# Patient Record
Sex: Female | Born: 1945 | Race: White | Hispanic: No | Smoking: Never smoker
Health system: Southern US, Community
[De-identification: ages and names within clinical notes are randomized; demographics above are authoritative.]

## PROBLEM LIST (undated history)

## (undated) DIAGNOSIS — G43909 Migraine, unspecified, not intractable, without status migrainosus: Secondary | ICD-10-CM

## (undated) DIAGNOSIS — G20C Parkinsonism, unspecified: Secondary | ICD-10-CM

## (undated) DIAGNOSIS — E1169 Type 2 diabetes mellitus with other specified complication: Secondary | ICD-10-CM

## (undated) DIAGNOSIS — J301 Allergic rhinitis due to pollen: Secondary | ICD-10-CM

## (undated) DIAGNOSIS — D649 Anemia, unspecified: Secondary | ICD-10-CM

## (undated) DIAGNOSIS — E039 Hypothyroidism, unspecified: Secondary | ICD-10-CM

## (undated) DIAGNOSIS — M1990 Unspecified osteoarthritis, unspecified site: Secondary | ICD-10-CM

## (undated) DIAGNOSIS — I10 Essential (primary) hypertension: Secondary | ICD-10-CM

## (undated) DIAGNOSIS — E785 Hyperlipidemia, unspecified: Secondary | ICD-10-CM

## (undated) DIAGNOSIS — E669 Obesity, unspecified: Secondary | ICD-10-CM

## (undated) HISTORY — PX: OTHER SURGICAL HISTORY: SHX169

## (undated) HISTORY — DX: Hyperlipidemia, unspecified: E78.5

## (undated) HISTORY — DX: Essential (primary) hypertension: I10

## (undated) HISTORY — DX: Migraine, unspecified, not intractable, without status migrainosus: G43.909

## (undated) HISTORY — DX: Hypothyroidism, unspecified: E03.9

## (undated) HISTORY — DX: Parkinsonism, unspecified: G20.C

## (undated) HISTORY — DX: Obesity, unspecified: E66.9

## (undated) HISTORY — DX: Anemia, unspecified: D64.9

## (undated) HISTORY — DX: Unspecified osteoarthritis, unspecified site: M19.90

## (undated) HISTORY — DX: Allergic rhinitis due to pollen: J30.1

## (undated) HISTORY — PX: TUBAL LIGATION: SHX77

## (undated) HISTORY — DX: Type 2 diabetes mellitus with other specified complication: E11.69

---

## 1999-04-18 ENCOUNTER — Encounter: Admission: RE | Admit: 1999-04-18 | Discharge: 1999-04-18 | Payer: Self-pay | Admitting: Orthopedic Surgery

## 1999-04-18 ENCOUNTER — Encounter: Payer: Self-pay | Admitting: Orthopedic Surgery

## 1999-06-18 ENCOUNTER — Other Ambulatory Visit: Admission: RE | Admit: 1999-06-18 | Discharge: 1999-06-18 | Payer: Self-pay | Admitting: Gynecology

## 2000-09-03 ENCOUNTER — Other Ambulatory Visit: Admission: RE | Admit: 2000-09-03 | Discharge: 2000-09-03 | Payer: Self-pay | Admitting: Gynecology

## 2000-10-13 ENCOUNTER — Encounter (INDEPENDENT_AMBULATORY_CARE_PROVIDER_SITE_OTHER): Payer: Self-pay | Admitting: Specialist

## 2000-10-13 ENCOUNTER — Other Ambulatory Visit: Admission: RE | Admit: 2000-10-13 | Discharge: 2000-10-13 | Payer: Self-pay | Admitting: Gynecology

## 2001-05-31 ENCOUNTER — Other Ambulatory Visit: Admission: RE | Admit: 2001-05-31 | Discharge: 2001-05-31 | Payer: Self-pay | Admitting: Gynecology

## 2002-08-16 ENCOUNTER — Other Ambulatory Visit: Admission: RE | Admit: 2002-08-16 | Discharge: 2002-08-16 | Payer: Self-pay | Admitting: Obstetrics and Gynecology

## 2004-09-16 ENCOUNTER — Other Ambulatory Visit: Admission: RE | Admit: 2004-09-16 | Discharge: 2004-09-16 | Payer: Self-pay | Admitting: Gynecology

## 2009-03-24 HISTORY — PX: TOTAL HIP ARTHROPLASTY: SHX124

## 2009-10-03 ENCOUNTER — Inpatient Hospital Stay (HOSPITAL_COMMUNITY): Admission: RE | Admit: 2009-10-03 | Discharge: 2009-10-06 | Payer: Self-pay | Admitting: Orthopedic Surgery

## 2010-06-08 LAB — URINALYSIS, MICROSCOPIC ONLY
Bilirubin Urine: NEGATIVE
Glucose, UA: NEGATIVE mg/dL
Ketones, ur: NEGATIVE mg/dL
Leukocytes, UA: NEGATIVE
Nitrite: NEGATIVE
Protein, ur: NEGATIVE mg/dL
Specific Gravity, Urine: 1.018 (ref 1.005–1.030)
Urobilinogen, UA: 1 mg/dL (ref 0.0–1.0)
pH: 6 (ref 5.0–8.0)

## 2010-06-08 LAB — BASIC METABOLIC PANEL
BUN: 6 mg/dL (ref 6–23)
BUN: 7 mg/dL (ref 6–23)
CO2: 29 mEq/L (ref 19–32)
CO2: 29 mEq/L (ref 19–32)
Calcium: 8.3 mg/dL — ABNORMAL LOW (ref 8.4–10.5)
Calcium: 8.5 mg/dL (ref 8.4–10.5)
Chloride: 105 mEq/L (ref 96–112)
Chloride: 106 mEq/L (ref 96–112)
Creatinine, Ser: 0.77 mg/dL (ref 0.4–1.2)
Creatinine, Ser: 0.83 mg/dL (ref 0.4–1.2)
GFR calc Af Amer: >60 mL/min (ref >60)
GFR calc Af Amer: >60 mL/min (ref >60)
GFR calc non Af Amer: >60 mL/min (ref >60)
GFR calc non Af Amer: >60 mL/min (ref >60)
Glucose, Bld: 153 mg/dL — ABNORMAL HIGH (ref 70–99)
Glucose, Bld: 173 mg/dL — ABNORMAL HIGH (ref 70–99)
Potassium: 3.4 mEq/L — ABNORMAL LOW (ref 3.5–5.1)
Potassium: 3.6 mEq/L (ref 3.5–5.1)
Sodium: 138 mEq/L (ref 135–145)
Sodium: 142 mEq/L (ref 135–145)

## 2010-06-08 LAB — CBC
HCT: 22.3 % — ABNORMAL LOW (ref 36.0–46.0)
HCT: 31.9 % — ABNORMAL LOW (ref 36.0–46.0)
Hemoglobin: 11 g/dL — ABNORMAL LOW (ref 12.0–15.0)
Hemoglobin: 7.8 g/dL — ABNORMAL LOW (ref 12.0–15.0)
MCH: 30.9 pg (ref 26.0–34.0)
MCH: 31.1 pg (ref 26.0–34.0)
MCHC: 34.6 g/dL (ref 30.0–36.0)
MCHC: 35 g/dL (ref 30.0–36.0)
MCV: 89 fL (ref 78.0–100.0)
MCV: 89.1 fL (ref 78.0–100.0)
Platelets: 131 10*3/uL — ABNORMAL LOW (ref 150–400)
Platelets: 148 10*3/uL — ABNORMAL LOW (ref 150–400)
RBC: 2.5 MIL/uL — ABNORMAL LOW (ref 3.87–5.11)
RBC: 3.58 MIL/uL — ABNORMAL LOW (ref 3.87–5.11)
RDW: 14.8 % (ref 11.5–15.5)
RDW: 15.3 % (ref 11.5–15.5)
WBC: 6.7 10*3/uL (ref 4.0–10.5)
WBC: 7 10*3/uL (ref 4.0–10.5)

## 2010-06-08 LAB — PROTIME-INR
INR: 1.52 — ABNORMAL HIGH (ref 0.00–1.49)
INR: 1.74 — ABNORMAL HIGH (ref 0.00–1.49)
Prothrombin Time: 18.2 seconds — ABNORMAL HIGH (ref 11.6–15.2)
Prothrombin Time: 20.2 seconds — ABNORMAL HIGH (ref 11.6–15.2)

## 2010-06-08 LAB — PREPARE RBC (CROSSMATCH)

## 2010-06-09 LAB — TYPE AND SCREEN
ABO/RH(D): O POS
Antibody Screen: NEGATIVE

## 2010-06-09 LAB — COMPREHENSIVE METABOLIC PANEL
ALT: 35 U/L (ref 0–35)
AST: 26 U/L (ref 0–37)
Albumin: 4.3 g/dL (ref 3.5–5.2)
Alkaline Phosphatase: 78 U/L (ref 39–117)
BUN: 11 mg/dL (ref 6–23)
CO2: 29 mEq/L (ref 19–32)
Calcium: 10.1 mg/dL (ref 8.4–10.5)
Chloride: 105 mEq/L (ref 96–112)
Creatinine, Ser: 0.84 mg/dL (ref 0.4–1.2)
GFR calc Af Amer: >60 mL/min (ref >60)
GFR calc non Af Amer: >60 mL/min (ref >60)
Glucose, Bld: 124 mg/dL — ABNORMAL HIGH (ref 70–99)
Potassium: 3.9 mEq/L (ref 3.5–5.1)
Sodium: 142 mEq/L (ref 135–145)
Total Bilirubin: 1 mg/dL (ref 0.3–1.2)
Total Protein: 7.3 g/dL (ref 6.0–8.3)

## 2010-06-09 LAB — PROTIME-INR
INR: 1.05 (ref 0.00–1.49)
INR: 1.18 (ref 0.00–1.49)
Prothrombin Time: 13.6 seconds (ref 11.6–15.2)
Prothrombin Time: 14.9 seconds (ref 11.6–15.2)

## 2010-06-09 LAB — CBC
HCT: 25.6 % — ABNORMAL LOW (ref 36.0–46.0)
HCT: 38.8 % (ref 36.0–46.0)
Hemoglobin: 13.7 g/dL (ref 12.0–15.0)
Hemoglobin: 8.9 g/dL — ABNORMAL LOW (ref 12.0–15.0)
MCH: 30.9 pg (ref 26.0–34.0)
MCH: 31.4 pg (ref 26.0–34.0)
MCHC: 34.9 g/dL (ref 30.0–36.0)
MCHC: 35.2 g/dL (ref 30.0–36.0)
MCV: 88.5 fL (ref 78.0–100.0)
MCV: 89 fL (ref 78.0–100.0)
Platelets: 158 10*3/uL (ref 150–400)
Platelets: 187 10*3/uL (ref 150–400)
RBC: 2.89 MIL/uL — ABNORMAL LOW (ref 3.87–5.11)
RBC: 4.36 MIL/uL (ref 3.87–5.11)
RDW: 14.3 % (ref 11.5–15.5)
RDW: 14.7 % (ref 11.5–15.5)
WBC: 4.5 10*3/uL (ref 4.0–10.5)
WBC: 6.4 10*3/uL (ref 4.0–10.5)

## 2010-06-09 LAB — BASIC METABOLIC PANEL
BUN: 7 mg/dL (ref 6–23)
CO2: 29 mEq/L (ref 19–32)
Calcium: 8.5 mg/dL (ref 8.4–10.5)
Chloride: 107 mEq/L (ref 96–112)
Creatinine, Ser: 0.73 mg/dL (ref 0.4–1.2)
GFR calc Af Amer: >60 mL/min (ref >60)
GFR calc non Af Amer: >60 mL/min (ref >60)
Glucose, Bld: 137 mg/dL — ABNORMAL HIGH (ref 70–99)
Potassium: 4.6 mEq/L (ref 3.5–5.1)
Sodium: 140 mEq/L (ref 135–145)

## 2010-06-09 LAB — URINALYSIS, ROUTINE W REFLEX MICROSCOPIC
Bilirubin Urine: NEGATIVE
Glucose, UA: NEGATIVE mg/dL
Hgb urine dipstick: NEGATIVE
Ketones, ur: NEGATIVE mg/dL
Nitrite: NEGATIVE
Protein, ur: NEGATIVE mg/dL
Specific Gravity, Urine: 1.013 (ref 1.005–1.030)
Urobilinogen, UA: 0.2 mg/dL (ref 0.0–1.0)
pH: 6 (ref 5.0–8.0)

## 2010-06-09 LAB — ABO/RH: ABO/RH(D): O POS

## 2010-06-09 LAB — APTT: aPTT: 33 seconds (ref 24–37)

## 2010-06-09 LAB — SURGICAL PCR SCREEN
MRSA, PCR: NEGATIVE
Staphylococcus aureus: NEGATIVE

## 2012-06-14 ENCOUNTER — Telehealth: Payer: Self-pay

## 2012-06-14 NOTE — Telephone Encounter (Signed)
Pt has appt to establish with Dr Patsy Lager on 06/21/12; pt going over her paperwork and not sure what records to request from her previous physician. Pt said she will bring form with her to 03/31 appt and get Dr Cyndie Chime guidance of what information if any he would like from pts previous physician if any.

## 2012-06-21 ENCOUNTER — Ambulatory Visit (INDEPENDENT_AMBULATORY_CARE_PROVIDER_SITE_OTHER): Payer: Medicare Other | Admitting: Family Medicine

## 2012-06-21 ENCOUNTER — Encounter: Payer: Self-pay | Admitting: Family Medicine

## 2012-06-21 VITALS — BP 132/83 | HR 92 | Temp 98.3°F | Ht 65.0 in | Wt 202.0 lb

## 2012-06-21 DIAGNOSIS — J301 Allergic rhinitis due to pollen: Secondary | ICD-10-CM

## 2012-06-21 DIAGNOSIS — I10 Essential (primary) hypertension: Secondary | ICD-10-CM | POA: Insufficient documentation

## 2012-06-21 DIAGNOSIS — M129 Arthropathy, unspecified: Secondary | ICD-10-CM

## 2012-06-21 DIAGNOSIS — E039 Hypothyroidism, unspecified: Secondary | ICD-10-CM

## 2012-06-21 DIAGNOSIS — M1990 Unspecified osteoarthritis, unspecified site: Secondary | ICD-10-CM

## 2012-06-21 DIAGNOSIS — R42 Dizziness and giddiness: Secondary | ICD-10-CM

## 2012-06-21 DIAGNOSIS — E785 Hyperlipidemia, unspecified: Secondary | ICD-10-CM

## 2012-06-21 DIAGNOSIS — E782 Mixed hyperlipidemia: Secondary | ICD-10-CM | POA: Insufficient documentation

## 2012-06-21 DIAGNOSIS — G43909 Migraine, unspecified, not intractable, without status migrainosus: Secondary | ICD-10-CM

## 2012-06-21 LAB — CBC WITH DIFFERENTIAL/PLATELET
Basophils Absolute: 0 10*3/uL (ref 0.0–0.1)
Basophils Relative: 0.3 % (ref 0.0–3.0)
Eosinophils Absolute: 0.1 10*3/uL (ref 0.0–0.7)
Eosinophils Relative: 0.7 % (ref 0.0–5.0)
HCT: 41 % (ref 36.0–46.0)
Hemoglobin: 13.8 g/dL (ref 12.0–15.0)
Lymphocytes Relative: 16.1 % (ref 12.0–46.0)
Lymphs Abs: 1.3 10*3/uL (ref 0.7–4.0)
MCHC: 33.6 g/dL (ref 30.0–36.0)
MCV: 87 fl (ref 78.0–100.0)
Monocytes Absolute: 0.6 10*3/uL (ref 0.1–1.0)
Monocytes Relative: 7.6 % (ref 3.0–12.0)
Neutro Abs: 5.9 10*3/uL (ref 1.4–7.7)
Neutrophils Relative %: 75.3 % (ref 43.0–77.0)
Platelets: 207 10*3/uL (ref 150.0–400.0)
RBC: 4.71 Mil/uL (ref 3.87–5.11)
RDW: 14.1 % (ref 11.5–14.6)
WBC: 7.8 10*3/uL (ref 4.5–10.5)

## 2012-06-21 LAB — BASIC METABOLIC PANEL
BUN: 12 mg/dL (ref 6–23)
CO2: 29 mEq/L (ref 19–32)
Calcium: 9.8 mg/dL (ref 8.4–10.5)
Chloride: 97 mEq/L (ref 96–112)
Creatinine, Ser: 0.9 mg/dL (ref 0.4–1.2)
GFR: 65.85 mL/min (ref >60.00)
Glucose, Bld: 137 mg/dL — ABNORMAL HIGH (ref 70–99)
Potassium: 3.9 mEq/L (ref 3.5–5.1)
Sodium: 136 mEq/L (ref 135–145)

## 2012-06-21 LAB — TSH: TSH: 1.98 u[IU]/mL (ref 0.35–5.50)

## 2012-06-21 NOTE — Patient Instructions (Signed)
F/u in next 6 months for full CPX

## 2012-06-21 NOTE — Progress Notes (Signed)
Nature conservation officer at [facility] [address] [place] [address] Phone: [phone] Fax: [phone]  Date:  06/21/2012   Name:  Kim Cole   DOB:  1945/11/22   MRN:  [id number] Gender: female Age: 67 y.o.  Primary Physician:  Hannah Beat, MD  Evaluating MD: Hannah Beat, MD   Chief Complaint: Establish Care   History of Present Illness:  Kim Cole is a 67 y.o. pleasant patient who presents with the following:  Was seeing [facility] in [place]. Works for [employer].   R hip replacement - Dr. Merlyn Albert  Father has carotid disease - plaque broke off.  Has always been normal - auscultation.   Has some concerns about snoring - sometimes all night long.  Sleep apnea? Never feels tired during the day  No labs in about 12-18 months. Needs thyroid rechecked.  Hand OA, R  Now will sometimes feel a little bit dizzy. Took BP and it was in normal range. A month or 2 ago when that was happening she donated a couple of bags of what sounds like plasma.  Patient Active Problem List  Diagnosis  . Arthritis  . Allergic rhinitis due to pollen  . Hyperlipidemia  . Hypertension  . Migraine  . Hypothyroid    Past Medical History  Diagnosis Date  . Anemia   . Arthritis   . Allergic rhinitis due to pollen   . Hyperlipidemia   . Hypertension   . Migraine   . Hypothyroid     Past Surgical History  Procedure Laterality Date  . Tubal ligation    . Total hip arthroplasty Right 2011    Dr. Merlyn Albert    History   Social History  . Marital Status: Married    Spouse Name: N/A    Number of Children: N/A  . Years of Education: N/A   Occupational History  .  [employer]   Social History Main Topics  . Smoking status: Never Smoker   . Smokeless tobacco: Never Used  . Alcohol Use: Yes  . Drug Use: No  . Sexually Active: Not on file   Other Topics Concern  . Not on file   Social History Narrative   Worked  40 years for [employer] / [employer]   Married   Children and Clinton Gallant Rueger's mother    No family history on file.  No Known Allergies  No current outpatient prescriptions on file prior to visit.   No current facility-administered medications on file prior to visit.     Review of Systems:   GEN: No acute illnesses, no fevers, chills. GI: No n/v/d, eating normally Pulm: No SOB Interactive and getting along well at home.  Otherwise, ROS is as per the HPI.   Physical Examination: Filed Vitals:   06/21/12 1355 06/21/12 1545 06/21/12 1551 06/21/12 1552  BP: 130/80 141/82 125/82 132/83  Pulse: 82 80 84 92  Temp: 98.3 F (36.8 C)     TempSrc: Oral     Height: 5\' 5"  (1.651 m)     Weight: 202 lb (91.627 kg)     SpO2: 96%        Ideal Body Weight: Weight in (lb) to have BMI = 25: 149.9   GEN: WDWN, NAD, Non-toxic, A & O x 3 HEENT: Atraumatic, Normocephalic. Neck supple. No masses, No LAD. Ears and Nose: No external deformity. CV: RRR, No M/G/R. No JVD. No thrill. No extra heart  sounds. PULM: CTA B, no wheezes, crackles, rhonchi. No retractions. No resp. distress. No accessory muscle use. EXTR: No c/c/e NEURO Normal gait.  PSYCH: Normally interactive. Conversant. Not depressed or anxious appearing.  Calm demeanor.    Assessment and Plan:  Hypothyroid - Plan: TSH  Arthritis  Allergic rhinitis due to pollen  Hyperlipidemia  Hypertension  Migraine  Dizziness and giddiness - Plan: CBC with Differential, Basic metabolic panel  Generally doing well.  Check labs to rule out basic dizziness causes. BP does drop some with movement, suggested keeping hydrated.  Results for orders placed in visit on 06/21/12  TSH      Result Value Range   TSH 1.98  0.35 - 5.50 uIU/mL  CBC WITH DIFFERENTIAL      Result Value Range   WBC 7.8  4.5 - 10.5 K/uL   RBC 4.71  3.87 - 5.11 Mil/uL   Hemoglobin 13.8  12.0 - 15.0 g/dL   HCT  14.7  82.9 - 56.2 %   MCV 87.0  78.0 - 100.0 fl   MCHC 33.6  30.0 - 36.0 g/dL   RDW 13.0  86.5 - 78.4 %   Platelets 207.0  150.0 - 400.0 K/uL   Neutrophils Relative 75.3  43.0 - 77.0 %   Lymphocytes Relative 16.1  12.0 - 46.0 %   Monocytes Relative 7.6  3.0 - 12.0 %   Eosinophils Relative 0.7  0.0 - 5.0 %   Basophils Relative 0.3  0.0 - 3.0 %   Neutro Abs 5.9  1.4 - 7.7 K/uL   Lymphs Abs 1.3  0.7 - 4.0 K/uL   Monocytes Absolute 0.6  0.1 - 1.0 K/uL   Eosinophils Absolute 0.1  0.0 - 0.7 K/uL   Basophils Absolute 0.0  0.0 - 0.1 K/uL  BASIC METABOLIC PANEL      Result Value Range   Sodium 136  135 - 145 mEq/L   Potassium 3.9  3.5 - 5.1 mEq/L   Chloride 97  96 - 112 mEq/L   CO2 29  19 - 32 mEq/L   Glucose, Bld 137 (*) 70 - 99 mg/dL   BUN 12  6 - 23 mg/dL   Creatinine, Ser 0.9  0.4 - 1.2 mg/dL   Calcium 9.8  8.4 - 69.6 mg/dL   GFR 29.52  >84.13 mL/min     Orders Today:  Orders Placed This Encounter  Procedures  . TSH  . CBC with Differential  . Basic metabolic panel    Updated Medication List: (Includes new medications, updates to list, dose adjustments) Meds ordered this encounter  Medications  . lisinopril-hydrochlorothiazide (PRINZIDE,ZESTORETIC) 10-12.5 MG per tablet    Sig: Take 1 tablet by mouth daily.  Marland Kitchen levothyroxine (SYNTHROID, LEVOTHROID) 88 MCG tablet    Sig: Take 88 mcg by mouth daily.  . simvastatin (ZOCOR) 20 MG tablet    Sig: Take 20 mg by mouth every evening.  . vitamin E 400 UNIT capsule    Sig: Take 400 Units by mouth daily.  . Misc Natural Products (GLUCOSAMINE CHONDROITIN VIT D3) CAPS    Sig: Take by mouth.    Medications Discontinued: There are no discontinued medications.    Signed, Elpidio Galea. Kyrianna Barletta, MD 06/21/2012 2:11 PM

## 2012-06-22 ENCOUNTER — Encounter: Payer: Self-pay | Admitting: Family Medicine

## 2012-06-23 ENCOUNTER — Encounter: Payer: Self-pay | Admitting: *Deleted

## 2012-07-08 ENCOUNTER — Other Ambulatory Visit: Payer: Self-pay

## 2012-07-08 NOTE — Telephone Encounter (Signed)
Pt left v/m requesting levothyroxine 88 mcg be called in but pt did not leave pharmacy. Left v/m for pt to call back with pharmacy info.

## 2012-07-12 ENCOUNTER — Ambulatory Visit: Payer: Self-pay | Admitting: Family Medicine

## 2012-07-12 MED ORDER — LEVOTHYROXINE SODIUM 88 MCG PO TABS: 88.0000 ug | ORAL_TABLET | Freq: Every day | ORAL | Status: DC

## 2012-07-12 NOTE — Telephone Encounter (Signed)
Pt said she does not have acct set up with [facility] yet so needs written rx Levothyroxine . Call pt when rx ready for pick up.

## 2012-07-12 NOTE — Telephone Encounter (Signed)
  Hannah Beat, MD 07/12/2012, 10:13 AM

## 2012-08-05 ENCOUNTER — Other Ambulatory Visit: Payer: Self-pay

## 2012-08-05 MED ORDER — SIMVASTATIN 20 MG PO TABS: 20.0000 mg | ORAL_TABLET | Freq: Every evening | ORAL | Status: DC

## 2012-08-05 MED ORDER — LISINOPRIL-HYDROCHLOROTHIAZIDE 10-12.5 MG PO TABS: 1.0000 | ORAL_TABLET | Freq: Every day | ORAL | Status: DC

## 2012-08-05 NOTE — Telephone Encounter (Signed)
Pt request refill lisinopril HCTZ and simvastatin to [facility]. Advised pt done.

## 2012-10-11 ENCOUNTER — Encounter: Payer: Self-pay | Admitting: Family Medicine

## 2012-10-11 ENCOUNTER — Ambulatory Visit (INDEPENDENT_AMBULATORY_CARE_PROVIDER_SITE_OTHER): Payer: Medicare Other | Admitting: Family Medicine

## 2012-10-11 VITALS — BP 124/80 | HR 72 | Temp 98.3°F | Ht 65.0 in | Wt 203.8 lb

## 2012-10-11 DIAGNOSIS — R7309 Other abnormal glucose: Secondary | ICD-10-CM

## 2012-10-11 DIAGNOSIS — R739 Hyperglycemia, unspecified: Secondary | ICD-10-CM

## 2012-10-11 DIAGNOSIS — Z23 Encounter for immunization: Secondary | ICD-10-CM

## 2012-10-11 DIAGNOSIS — E039 Hypothyroidism, unspecified: Secondary | ICD-10-CM

## 2012-10-11 DIAGNOSIS — Z0000 Encounter for general adult medical examination without abnormal findings: Secondary | ICD-10-CM

## 2012-10-11 LAB — HEMOGLOBIN A1C: Hgb A1c MFr Bld: 7.1 % — ABNORMAL HIGH (ref 4.6–6.5)

## 2012-10-11 LAB — BASIC METABOLIC PANEL
BUN: 16 mg/dL (ref 6–23)
CO2: 28 mEq/L (ref 19–32)
Calcium: 9.5 mg/dL (ref 8.4–10.5)
Chloride: 102 mEq/L (ref 96–112)
Creatinine, Ser: 1 mg/dL (ref 0.4–1.2)
GFR: 61.85 mL/min (ref >60.00)
Glucose, Bld: 203 mg/dL — ABNORMAL HIGH (ref 70–99)
Potassium: 4 mEq/L (ref 3.5–5.1)
Sodium: 138 mEq/L (ref 135–145)

## 2012-10-11 LAB — TSH: TSH: 3.32 u[IU]/mL (ref 0.35–5.50)

## 2012-10-11 NOTE — Progress Notes (Signed)
Nature conservation officer at [facility] [address] [place] [address] Phone: [phone] Fax: [phone]  Date:  10/11/2012   Name:  Kim Cole   DOB:  08/15/1945   MRN:  [id number] Gender: female Age: 67 y.o.  Primary Physician:  Hannah Beat, MD  Evaluating MD: Hannah Beat, MD   Chief Complaint: Annual Exam   History of Present Illness:  Kim Cole is a 67 y.o. pleasant patient who presents with the following:  Medicare wellness:   Diabetes.  Colonoscopy, 3 years ago. Had  Polyps - 5 years.   Pneumovax - needs Shingles - vaccine done  [facility], mammogram done  Health Maintenance Summary Reviewed and updated, unless pt declines services.  Tobacco History Reviewed. Non-smoker Alcohol: No concerns, no excessive use Exercise Habits: none now STD concerns: none Drug Use: None Birth control method: n/a Menses regular: post-menopause Lumps or breast concerns: no  Health Maintenance  Topic Date Due  . Tetanus/tdap  01/12/1966  . Influenza Vaccine  11/22/2012  . Colonoscopy  03/24/2014  . Mammogram  10/12/2014  . Pneumococcal Polysaccharide Vaccine Age 61 And Over  Addressed  . Zostavax  Addressed    Labs reviewed with the patient.  Results for orders placed in visit on 06/21/12  TSH      Result Value Range   TSH 1.98  0.35 - 5.50 uIU/mL  CBC WITH DIFFERENTIAL      Result Value Range   WBC 7.8  4.5 - 10.5 K/uL   RBC 4.71  3.87 - 5.11 Mil/uL   Hemoglobin 13.8  12.0 - 15.0 g/dL   HCT 10.2  72.5 - 36.6 %   MCV 87.0  78.0 - 100.0 fl   MCHC 33.6  30.0 - 36.0 g/dL   RDW 44.0  34.7 - 42.5 %   Platelets 207.0  150.0 - 400.0 K/uL   Neutrophils Relative % 75.3  43.0 - 77.0 %   Lymphocytes Relative 16.1  12.0 - 46.0 %   Monocytes Relative 7.6  3.0 - 12.0 %   Eosinophils Relative 0.7  0.0 - 5.0 %   Basophils Relative 0.3  0.0 - 3.0 %   Neutro Abs 5.9  1.4 - 7.7 K/uL   Lymphs Abs 1.3  0.7 - 4.0 K/uL   Monocytes Absolute 0.6  0.1 -  1.0 K/uL   Eosinophils Absolute 0.1  0.0 - 0.7 K/uL   Basophils Absolute 0.0  0.0 - 0.1 K/uL  BASIC METABOLIC PANEL      Result Value Range   Sodium 136  135 - 145 mEq/L   Potassium 3.9  3.5 - 5.1 mEq/L   Chloride 97  96 - 112 mEq/L   CO2 29  19 - 32 mEq/L   Glucose, Bld 137 (*) 70 - 99 mg/dL   BUN 12  6 - 23 mg/dL   Creatinine, Ser 0.9  0.4 - 1.2 mg/dL   Calcium 9.8  8.4 - 95.6 mg/dL   GFR 38.75  >64.33 mL/min     Patient Active Problem List   Diagnosis Date Noted  . Arthritis   . Allergic rhinitis due to pollen   . Hyperlipidemia   . Hypertension   . Migraine   . Hypothyroid     Past Medical History  Diagnosis Date  . Anemia   . Arthritis   . Allergic rhinitis due to pollen   . Hyperlipidemia   . Hypertension   . Migraine   . Hypothyroid  Past Surgical History  Procedure Laterality Date  . Tubal ligation    . Total hip arthroplasty Right 2011    Dr. Merlyn Albert    History   Social History  . Marital Status: Married    Spouse Name: N/A    Number of Children: N/A  . Years of Education: N/A   Occupational History  .  [employer]   Social History Main Topics  . Smoking status: Never Smoker   . Smokeless tobacco: Never Used  . Alcohol Use: Yes  . Drug Use: No  . Sexually Active: Not on file   Other Topics Concern  . Not on file   Social History Narrative   Worked 40 years for [employer] / [employer]   Married   Children and Clinton Gallant Speakman's mother    No family history on file.  No Known Allergies  Medication list has been reviewed and updated.  Outpatient Prescriptions Prior to Visit  Medication Sig Dispense Refill  . levothyroxine (SYNTHROID, LEVOTHROID) 88 MCG tablet Take 1 tablet (88 mcg total) by mouth daily.  90 tablet  3  . lisinopril-hydrochlorothiazide (PRINZIDE,ZESTORETIC) 10-12.5 MG per tablet Take 1 tablet by mouth daily.  90 tablet  1  . simvastatin (ZOCOR) 20 MG tablet  Take 1 tablet (20 mg total) by mouth every evening.  90 tablet  1  . vitamin E 400 UNIT capsule Take 400 Units by mouth daily.      . Misc Natural Products (GLUCOSAMINE CHONDROITIN VIT D3) CAPS Take by mouth.       No facility-administered medications prior to visit.    Review of Systems:   General: Denies fever, chills, sweats. No significant weight loss. Eyes: Denies blurring,significant itching ENT: Denies earache, sore throat, and hoarseness.  Cardiovascular: Denies chest pains, palpitations, dyspnea on exertion,  Respiratory: Denies cough, dyspnea at rest,wheeezing Breast: no concerns about lumps GI: Denies nausea, vomiting, diarrhea, constipation, change in bowel habits, abdominal pain, melena, hematochezia GU: Denies dysuria, hematuria, urinary hesitancy, nocturia, denies STD risk, no concerns about discharge Musculoskeletal: Denies back pain, joint pain Derm: Denies rash, itching Neuro: Denies  paresthesias, frequent falls, frequent headaches Psych: Denies depression, anxiety Endocrine: Denies cold intolerance, heat intolerance, polydipsia Heme: Denies enlarged lymph nodes Allergy: No hayfever  Physical Examination: BP 124/80  Pulse 72  Temp(Src) 98.3 F (36.8 C) (Oral)  Ht 5\' 5"  (1.651 m)  Wt 203 lb 12 oz (92.42 kg)  BMI 33.91 kg/m2  Ideal Body Weight: Weight in (lb) to have BMI = 25: 149.9   GEN: well developed, well nourished, no acute distress Eyes: conjunctiva and lids normal, PERRLA, EOMI ENT: TM clear, nares clear, oral exam WNL Neck: supple, no lymphadenopathy, no thyromegaly, no JVD Pulm: clear to auscultation and percussion, respiratory effort normal CV: regular rate and rhythm, S1-S2, no murmur, rub or gallop, no bruits Chest: no scars, masses, no lumps BREAST: breast exam declined GI: soft, non-tender; no hepatosplenomegaly, masses; active bowel sounds all quadrants GU: GU exam declined Lymph: no cervical, axillary or inguinal adenopathy MSK: gait  normal, muscle tone and strength WNL, no joint swelling, effusions, discoloration, crepitus  SKIN: clear, good turgor, color WNL, no rashes, lesions, or ulcerations Neuro: normal mental status, normal strength, sensation, and motion Psych: alert; oriented to person, place and time, normally interactive and not anxious or depressed in appearance.   Assessment and Plan:  Routine general medical examination at a health care facility  Hyperglycemia - Plan: Basic metabolic  panel, Hemoglobin A1c  Hypothyroid - Plan: TSH  I have personally reviewed the Medicare Annual Wellness questionnaire and have noted 1. The patient's medical and social history 2. Their use of alcohol, tobacco or illicit drugs 3. Their current medications and supplements 4. The patient's functional ability including ADL's, fall risks, home safety risks and hearing or visual             impairment. 5. Diet and physical activities 6. Evidence for depression or mood disorders  The patients weight, height, BMI and visual acuity have been recorded in the chart I have made referrals, counseling and provided education to the patient based review of the above and I have provided the pt with a written personalized care plan for preventive services.  I have provided the patient with a copy of your personalized plan for preventive services. Instructed to take the time to review along with their updated medication list.   BS at 176 at work labs, recheck and check a1c. Check thyroid  Orders Today:  Orders Placed This Encounter  Procedures  . Basic metabolic panel  . Hemoglobin A1c  . TSH    Updated Medication List: (Includes new medications, updates to list, dose adjustments) Meds ordered this encounter  Medications  . cholecalciferol (VITAMIN D) 1000 UNITS tablet    Sig: Take 1,000 Units by mouth daily.    Medications Discontinued: There are no discontinued medications.    Signed, Elpidio Galea. [employer],  MD 10/11/2012 8:45 AM

## 2012-10-11 NOTE — Addendum Note (Signed)
Addended by: Sydell Axon C on: 10/11/2012 01:22 PM   Modules accepted: Orders

## 2012-10-18 ENCOUNTER — Encounter: Payer: Self-pay | Admitting: Family Medicine

## 2012-11-01 ENCOUNTER — Telehealth: Payer: Self-pay

## 2012-11-01 NOTE — Telephone Encounter (Signed)
Pt has billing question about denial of visit due to coding. Pt will call [facility] at [phone]./

## 2013-02-18 ENCOUNTER — Other Ambulatory Visit: Payer: Self-pay | Admitting: *Deleted

## 2013-02-18 MED ORDER — LISINOPRIL-HYDROCHLOROTHIAZIDE 10-12.5 MG PO TABS: 1.0000 | ORAL_TABLET | Freq: Every day | ORAL | Status: DC

## 2013-03-21 ENCOUNTER — Other Ambulatory Visit: Payer: Self-pay | Admitting: *Deleted

## 2013-03-21 MED ORDER — SIMVASTATIN 20 MG PO TABS: 20.0000 mg | ORAL_TABLET | Freq: Every evening | ORAL | Status: DC

## 2013-05-30 ENCOUNTER — Telehealth: Payer: Self-pay

## 2013-05-30 NOTE — Telephone Encounter (Signed)
Pt called back and pt has contacted [facility] and [facility] will contact office for any needed refills. I asked pt about scheduling f/u glucose labs from result note 10/11/13 and pt said she had never got a call or letter with the lab results. Pt will entertain the idea of diabetic classes and wants to know more info about classes(where classes held, times available for classes and cost) and what lab test does pt need to schedule.Please advise.

## 2013-05-30 NOTE — Telephone Encounter (Signed)
Pt left v/m requesting cb about lisinopril HCTZ; pt has changed insurance co and new mail order is [facility]; left v/m for pt to cb to see if acct has been set up with [facility] and pt is to scheduled for glucose labs.

## 2013-05-31 ENCOUNTER — Other Ambulatory Visit: Payer: Self-pay

## 2013-05-31 MED ORDER — LISINOPRIL-HYDROCHLOROTHIAZIDE 10-12.5 MG PO TABS: 1.0000 | ORAL_TABLET | Freq: Every day | ORAL | Status: DC

## 2013-05-31 NOTE — Telephone Encounter (Signed)
Called and left message on machine.   Explored and discussed with staff, and it appears that my CMA at the time of this bloodwork, Zenda Alpers, did not call and alert the patient as instructed.   I have left message of apology with patient, and I will try to speak with her in person over the next couple of days.  Owens Loffler, MD

## 2013-05-31 NOTE — Telephone Encounter (Signed)
Pt left v/m requesting 30 day refill lisinopril HCTZ to [facility]. While waiting on mail order pharmacy; pt notified done by v/m.

## 2013-06-01 NOTE — Telephone Encounter (Signed)
Called and discussed all with her, explained lack of response from my prior CMA. Discussed all labs. She has been through some dietary classes through work, and has an upcoming blood draw through work that she will send to me.

## 2013-06-20 ENCOUNTER — Telehealth: Payer: Self-pay | Admitting: *Deleted

## 2013-06-20 MED ORDER — LISINOPRIL-HYDROCHLOROTHIAZIDE 10-12.5 MG PO TABS: 1.0000 | ORAL_TABLET | Freq: Every day | ORAL | Status: DC

## 2013-06-20 MED ORDER — LEVOTHYROXINE SODIUM 88 MCG PO TABS: 88.0000 ug | ORAL_TABLET | Freq: Every day | ORAL | Status: DC

## 2013-06-29 MED ORDER — LEVOTHYROXINE SODIUM 88 MCG PO TABS: 88.0000 ug | ORAL_TABLET | Freq: Every day | ORAL | Status: DC

## 2013-06-29 NOTE — Telephone Encounter (Signed)
Pt left v/m requesting status of levothyroxine refill to [facility]. Was levothyroxine rx faxed to [facility]; 06/20/13 note appears rx printed.Please advise. Pt request cb.

## 2013-06-29 NOTE — Addendum Note (Signed)
Addended by: Carter Kitten on: 06/29/2013 12:56 PM   Modules accepted: Orders

## 2013-06-29 NOTE — Telephone Encounter (Signed)
Left message for Kim Cole that I have resent her refill for levothyroxine in to [facility] electronically.

## 2013-06-29 NOTE — Telephone Encounter (Signed)
I am not sure if this refill was faxed since I did not handle this refill request.  But I have just resent it in to [facility] electronically.

## 2013-08-29 ENCOUNTER — Other Ambulatory Visit: Payer: Self-pay | Admitting: *Deleted

## 2013-08-29 MED ORDER — LEVOTHYROXINE SODIUM 88 MCG PO TABS: 88.0000 ug | ORAL_TABLET | Freq: Every day | ORAL | Status: DC

## 2013-09-12 ENCOUNTER — Other Ambulatory Visit: Payer: Self-pay | Admitting: *Deleted

## 2013-09-12 NOTE — Telephone Encounter (Signed)
#  90, 1 ref  F/u CPX in fall

## 2013-09-12 NOTE — Telephone Encounter (Signed)
Ok to refill 

## 2013-09-12 NOTE — Telephone Encounter (Signed)
Fax refill request, last OV was 10/11/2012 and no future appt., will Rout to [facility] to determine when f/u or CPE needs to be scheduled

## 2013-09-13 MED ORDER — LISINOPRIL-HYDROCHLOROTHIAZIDE 10-12.5 MG PO TABS: 1.0000 | ORAL_TABLET | Freq: Every day | ORAL | Status: DC

## 2013-09-19 ENCOUNTER — Telehealth: Payer: Self-pay | Admitting: Family Medicine

## 2013-09-19 NOTE — Telephone Encounter (Signed)
Left messages on pt's home voice mail 06/24, 25, and 26/2015 for her to return call to schedule CPE. Patient never returned call.

## 2013-09-19 NOTE — Telephone Encounter (Signed)
Pt wanted to verify that refill was sent to pharmacy; pt already scheduled CPX 11/16/13. Advised pt med refill was sent.

## 2013-09-19 NOTE — Telephone Encounter (Signed)
Noted  

## 2013-11-01 ENCOUNTER — Other Ambulatory Visit: Payer: Self-pay | Admitting: *Deleted

## 2013-11-01 MED ORDER — SIMVASTATIN 20 MG PO TABS: 20.0000 mg | ORAL_TABLET | Freq: Every evening | ORAL | Status: DC

## 2013-11-16 ENCOUNTER — Ambulatory Visit (INDEPENDENT_AMBULATORY_CARE_PROVIDER_SITE_OTHER): Payer: Commercial Managed Care - HMO | Admitting: Family Medicine

## 2013-11-16 ENCOUNTER — Encounter: Payer: Self-pay | Admitting: Family Medicine

## 2013-11-16 VITALS — BP 110/70 | HR 69 | Temp 97.6°F | Ht 65.0 in | Wt 198.5 lb

## 2013-11-16 DIAGNOSIS — Z23 Encounter for immunization: Secondary | ICD-10-CM

## 2013-11-16 DIAGNOSIS — E119 Type 2 diabetes mellitus without complications: Secondary | ICD-10-CM

## 2013-11-16 DIAGNOSIS — I10 Essential (primary) hypertension: Secondary | ICD-10-CM

## 2013-11-16 DIAGNOSIS — E1169 Type 2 diabetes mellitus with other specified complication: Secondary | ICD-10-CM

## 2013-11-16 DIAGNOSIS — E669 Obesity, unspecified: Secondary | ICD-10-CM

## 2013-11-16 DIAGNOSIS — Z0000 Encounter for general adult medical examination without abnormal findings: Secondary | ICD-10-CM

## 2013-11-16 DIAGNOSIS — E785 Hyperlipidemia, unspecified: Secondary | ICD-10-CM

## 2013-11-16 DIAGNOSIS — E039 Hypothyroidism, unspecified: Secondary | ICD-10-CM

## 2013-11-16 DIAGNOSIS — E038 Other specified hypothyroidism: Secondary | ICD-10-CM

## 2013-11-16 NOTE — Progress Notes (Signed)
Pre visit review using our clinic review tool, if applicable. No additional management support is needed unless otherwise documented below in the visit note. 

## 2013-11-16 NOTE — Progress Notes (Signed)
Dr. [name] T. , MD, [facility] [place], [address] Phone: [phone] Fax: [phone]  11/16/2013  Patient: Kim Cole, MRN: [id number], DOB: 1946-02-02, 68 y.o.  Primary Physician:  Owens Loffler, MD  Chief Complaint: Annual Exam  Subjective:   Kim Cole is a 68 y.o. pleasant patient who presents for a medicare wellness examination:  Health Maintenance Summary Reviewed and updated, unless pt declines services.  Tobacco History Reviewed. Non-smoker Alcohol: No concerns, no excessive use Exercise Habits: Some activity, rec at least 30 mins 5 times a week STD concerns: none Drug Use: None Birth control method: n/a Menses regular: n/a Lumps or breast concerns: no Breast Cancer Family History: no  Health Maintenance  Topic Date Due  . Foot Exam  01/12/1957  . Ophthalmology Exam  01/12/1957  . Urine Microalbumin  01/12/1957  . Tetanus/tdap  01/12/1966  . Hemoglobin A1c  04/13/2013  . Influenza Vaccine  10/22/2013  . Colonoscopy  03/24/2014  . Mammogram  10/12/2014  . Pneumococcal Polysaccharide Vaccine Age 63 And Over  Completed  . Zostavax  Addressed   Immunization History  Administered Date(s) Administered  . Pneumococcal Conjugate-13 11/16/2013  . Pneumococcal Polysaccharide-23 10/11/2012   Lipids: Doing well, stable. Tolerating meds fine with no SE. Panel reviewed with patient - from work.  HTN: Tolerating all medications without side effects Stable and at goal No CP, no sob. No HA.  BP Readings from Last 3 Encounters:  11/16/13 110/70  10/11/12 124/80  06/21/12 270/78    Basic Metabolic Panel:    Component Value Date/Time   NA 138 10/11/2012 0928   K 4.0 10/11/2012 0928   CL 102 10/11/2012 0928   CO2 28 10/11/2012 0928   BUN 16 10/11/2012 0928   CREATININE 1.0 10/11/2012 0928   GLUCOSE 203* 10/11/2012 0928   CALCIUM 9.5 10/11/2012 0928   Diabetes: most recent  a1c today is 7.9 - not on meds up until this point.   Lab Results  Component Value Date   ALT 35 09/21/2009   AST 26 09/21/2009   ALKPHOS 78 09/21/2009   BILITOT 1.0 09/21/2009     Patient Active Problem List   Diagnosis Date Noted  . Diabetes mellitus type 2 in obese 11/17/2013    Priority: High  . Hyperlipidemia     Priority: Medium  . Hypertension     Priority: Medium  . Hypothyroid     Priority: Medium  . Arthritis   . Allergic rhinitis due to pollen   . Migraine    Past Medical History  Diagnosis Date  . Anemia   . Arthritis   . Allergic rhinitis due to pollen   . Hyperlipidemia   . Hypertension   . Migraine   . Hypothyroid   . Diabetes mellitus type 2 in obese 11/17/2013   Past Surgical History  Procedure Laterality Date  . Tubal ligation    . Total hip arthroplasty Right 2011    Dr. Ricki Rodriguez   History   Social History  . Marital Status: Married    Spouse Name: N/A    Number of Children: N/A  . Years of Education: N/A   Occupational History  .  [employer]   Social History Main Topics  . Smoking status: Never Smoker   . Smokeless tobacco: Never Used  . Alcohol Use: Yes  . Drug Use: No  . Sexual Activity: Not on file   Other Topics  Concern  . Not on file   Social History Narrative   Worked 23 years for [employer] / [employer]   Married   Children and Glynda Jaeger Intriago's mother   No family history on file. No Known Allergies  Medication list has been reviewed and updated.   General: Denies fever, chills, sweats. No significant weight loss. Eyes: Denies blurring,significant itching ENT: Denies earache, sore throat, and hoarseness.  Cardiovascular: Denies chest pains, palpitations, dyspnea on exertion,  Respiratory: Denies cough, dyspnea at rest,wheeezing Breast: no concerns about lumps GI: Denies nausea, vomiting, diarrhea, constipation, change in bowel habits, abdominal pain, melena,  hematochezia GU: Denies dysuria, hematuria, urinary hesitancy, nocturia, denies STD risk, no concerns about discharge Musculoskeletal: Denies back pain, joint pain Derm: Denies rash, itching Neuro: Denies  paresthesias, frequent falls, frequent headaches Psych: Denies depression, anxiety Endocrine: Denies cold intolerance, heat intolerance, polydipsia Heme: Denies enlarged lymph nodes Allergy: No hayfever  Objective:   BP 110/70  Pulse 69  Temp(Src) 97.6 F (36.4 C) (Oral)  Ht 5' 5" (1.651 m)  Wt 198 lb 8 oz (90.039 kg)  BMI 33.03 kg/m2  The patient completed a fall screen and PHQ-2 and PHQ-9 if necessary, which is documented in the EHR. The CMA/LPN/RN who assisted the patient verbally completed with them and documented results in [facility] EHR.   Hearing Screening   Method: Audiometry   125Hz 250Hz 500Hz 1000Hz 2000Hz 4000Hz 8000Hz  Right ear:   _0 Left ear:   _1 Visual Acuity Screening   Right eye Left eye Both eyes  Without correction:     With correction: 20/13 20/13 20/13    GEN: well developed, well nourished, no acute distress Eyes: conjunctiva and lids normal, PERRLA, EOMI ENT: TM clear, nares clear, oral exam WNL Neck: supple, no lymphadenopathy, no thyromegaly, no JVD Pulm: clear to auscultation and percussion, respiratory effort normal CV: regular rate and rhythm, S1-S2, no murmur, rub or gallop, no bruits Chest: no scars, masses, no lumps BREAST: breast exam declined GI: soft, non-tender; no hepatosplenomegaly, masses; active bowel sounds all quadrants GU: GU exam declined Lymph: no cervical, axillary or inguinal adenopathy MSK: gait normal, muscle tone and strength WNL, no joint swelling, effusions, discoloration, crepitus  SKIN: clear, good turgor, color WNL, no rashes, lesions, or ulcerations Neuro: normal mental status, normal strength, sensation, and motion Psych: alert; oriented to person, place and time, normally  interactive and not anxious or depressed in appearance.   All labs reviewed with patient. Lipids: No results found for this basename: chol, trig, hdl, ldl, ldldirect, vldl, cholhdl   CBC: CBC Latest Ref Rng 06/21/2012 10/06/2009 10/05/2009  WBC 4.5 - 10.5 K/uL 7.8 7.0 6.7  Hemoglobin 12.0 - 15.0 g/dL 13.8 11.0 DELTA CHECK NOTED POST TRANSFUSION SPECIMEN(L) 7.8(L)  Hematocrit 36.0 - 46.0 % 41.0 31.9(L) 22.3(L)  Platelets 150.0 - 400.0 K/uL 207.0 148(L) 131(L)    Basic Metabolic Panel:    Component Value Date/Time   NA 138 10/11/2012 0928   K 4.0 10/11/2012 0928   CL 102 10/11/2012 0928   CO2 28 10/11/2012 0928   BUN 16 10/11/2012 0928   CREATININE 1.0 10/11/2012 0928   GLUCOSE 203* 10/11/2012 0928   CALCIUM 9.5 10/11/2012 0928   Hepatic Function Latest Ref Rng 09/21/2009  Total Protein 6.0 - 8.3 g/dL 7.3  Albumin 3.5 - 5.2 g/dL 4.3  AST 0 - 37 U/L  26  ALT 0 - 35 U/L 35  Alk Phosphatase 39 - 117 U/L 78  Total Bilirubin 0.3 - 1.2 mg/dL 1.0    Lab Results  Component Value Date   TSH 3.32 10/11/2012    Results for orders placed in visit on 68/34/19  BASIC METABOLIC PANEL      Result Value Ref Range   Sodium 138  135 - 145 mEq/L   Potassium 4.2  3.5 - 5.1 mEq/L   Chloride 101  96 - 112 mEq/L   CO2 29  19 - 32 mEq/L   Glucose, Bld 162 (*) 70 - 99 mg/dL   BUN 12  6 - 23 mg/dL   Creatinine, Ser 0.9  0.4 - 1.2 mg/dL   Calcium 10.1  8.4 - 10.5 mg/dL   GFR 65.57  >60.00 mL/min  HEMOGLOBIN A1C      Result Value Ref Range   Hemoglobin A1C 7.9 (*) 4.6 - 6.5 %  MICROALBUMIN / CREATININE URINE RATIO      Result Value Ref Range   Microalb, Ur 2.0 (*) 0.0 - 1.9 mg/dL   Creatinine,U 238.9     Microalb Creat Ratio 0.8  0.0 - 30.0 mg/g  TSH      Result Value Ref Range   TSH 2.06  0.35 - 4.50 uIU/mL     Assessment and Plan:   Routine general medical examination at a health care facility  Type II or unspecified type diabetes mellitus without mention of complication, not stated as  uncontrolled - Plan: Basic metabolic panel, Hemoglobin A1c, Microalbumin / creatinine urine ratio  Lab Results  Component Value Date   HGBA1C 7.9* 11/16/2013    Start metformin XR 500 mg, 2 po daily. ADA website, recheck 3 months.   Unspecified hypothyroidism - Plan: TSH, labs are stable, keep up the same dose.   Need for prophylactic vaccination against Streptococcus pneumoniae (pneumococcus) - Plan: Pneumococcal conjugate vaccine 13-valent  Diabetes mellitus type 2 in obese  Essential hypertension:  BP Readings from Last 3 Encounters:  11/16/13 110/70  10/11/12 124/80  06/21/12 132/83    Stable and at goal for her.   Hyperlipidemia: relatively stable on meds - will check at next OV for DM follow-up.  Health Maintenance Exam: The patient's preventative maintenance and recommended screening tests for an annual wellness exam were reviewed in full today. Brought up to date unless services declined.  Counselled on the importance of diet, exercise, and its role in overall health and mortality. The patient's FH and SH was reviewed, including their home life, tobacco status, and drug and alcohol status.  I have personally reviewed the Medicare Annual Wellness questionnaire and have noted 1. The patient's medical and social history 2. Their use of alcohol, tobacco or illicit drugs 3. Their current medications and supplements 4. The patient's functional ability including ADL's, fall risks, home safety risks and hearing or visual             impairment. 5. Diet and physical activities 6. Evidence for depression or mood disorders  The patients weight, height, BMI and visual acuity have been recorded in the chart I have made referrals, counseling and provided education to the patient based review of the above and I have provided the pt with a written personalized care plan for preventive services.  I have provided the patient with a copy of your personalized plan for preventive  services. Instructed to take the time to review along with their updated medication list.  Follow-up: No Follow-up on file. Or follow-up in 1 year for complete physical examination  New Prescriptions   No medications on file   Orders Placed This Encounter  Procedures  . Pneumococcal conjugate vaccine 13-valent  . Basic metabolic panel  . Hemoglobin A1c  . Microalbumin / creatinine urine ratio  . TSH    Signed,  [name] T. Jacky Hartung, MD   Patient's Medications  New Prescriptions   No medications on file  Previous Medications   CHOLECALCIFEROL (VITAMIN D) 1000 UNITS TABLET    Take 1,000 Units by mouth daily.   LEVOTHYROXINE (SYNTHROID, LEVOTHROID) 88 MCG TABLET    Take 1 tablet (88 mcg total) by mouth daily. Needs to see Dr and have fasting labs before any more refills.   LISINOPRIL-HYDROCHLOROTHIAZIDE (PRINZIDE,ZESTORETIC) 10-12.5 MG PER TABLET    Take 1 tablet by mouth daily.   MISC NATURAL PRODUCTS (GLUCOSAMINE CHONDROITIN VIT D3) CAPS    Take by mouth.   MULTIPLE VITAMINS-MINERALS (EYE VITAMINS) TABS    Take 1 tablet by mouth daily.   SIMVASTATIN (ZOCOR) 20 MG TABLET    Take 1 tablet (20 mg total) by mouth every evening.   VITAMIN B-12 (CYANOCOBALAMIN) 1000 MCG TABLET    Take 1,000 mcg by mouth daily.   VITAMIN E 400 UNIT CAPSULE    Take 400 Units by mouth daily.  Modified Medications   No medications on file  Discontinued Medications   No medications on file

## 2013-11-16 NOTE — Patient Instructions (Signed)
Tdap: get booster. Call your insurance to see if covered at all.

## 2013-11-17 ENCOUNTER — Encounter: Payer: Self-pay | Admitting: Family Medicine

## 2013-11-17 DIAGNOSIS — E119 Type 2 diabetes mellitus without complications: Secondary | ICD-10-CM | POA: Insufficient documentation

## 2013-11-17 DIAGNOSIS — E669 Obesity, unspecified: Secondary | ICD-10-CM | POA: Insufficient documentation

## 2013-11-17 DIAGNOSIS — E1169 Type 2 diabetes mellitus with other specified complication: Secondary | ICD-10-CM

## 2013-11-17 HISTORY — DX: Type 2 diabetes mellitus with other specified complication: E66.9

## 2013-11-17 HISTORY — DX: Type 2 diabetes mellitus with other specified complication: E11.69

## 2013-11-17 LAB — BASIC METABOLIC PANEL
BUN: 12 mg/dL (ref 6–23)
CO2: 29 mEq/L (ref 19–32)
Calcium: 10.1 mg/dL (ref 8.4–10.5)
Chloride: 101 mEq/L (ref 96–112)
Creatinine, Ser: 0.9 mg/dL (ref 0.4–1.2)
GFR: 65.57 mL/min (ref >60.00)
Glucose, Bld: 162 mg/dL — ABNORMAL HIGH (ref 70–99)
Potassium: 4.2 mEq/L (ref 3.5–5.1)
Sodium: 138 mEq/L (ref 135–145)

## 2013-11-17 LAB — MICROALBUMIN / CREATININE URINE RATIO
Creatinine,U: 238.9 mg/dL
Microalb Creat Ratio: 0.8 mg/g (ref 0.0–30.0)
Microalb, Ur: 2 mg/dL — ABNORMAL HIGH (ref 0.0–1.9)

## 2013-11-17 LAB — TSH: TSH: 2.06 u[IU]/mL (ref 0.35–4.50)

## 2013-11-17 LAB — HEMOGLOBIN A1C: Hgb A1c MFr Bld: 7.9 % — ABNORMAL HIGH (ref 4.6–6.5)

## 2013-11-22 DIAGNOSIS — E669 Obesity, unspecified: Secondary | ICD-10-CM | POA: Insufficient documentation

## 2013-11-23 ENCOUNTER — Telehealth: Payer: Self-pay | Admitting: *Deleted

## 2013-11-23 MED ORDER — METFORMIN HCL 500 MG PO TABS: 500.0000 mg | ORAL_TABLET | Freq: Two times a day (BID) | ORAL | Status: DC

## 2013-11-23 NOTE — Telephone Encounter (Signed)
Message copied by Carter Kitten on Wed Nov 23, 2013  6:20 PM ------      Message from: Owens Loffler      Created: Tue Nov 22, 2013  1:27 PM       Can you call her about her labs?            Blood sugar is up at 162 and her a1c is 7.9. Above 6.5 is considered diabetes. We talked about this a lot in office, but I think time to start on meds.             Metformin ER 500 mg, 2 po daily, #60, 5 refills.            F/u in the office with me in 3 months. (please schedule)            Recommend ADA website for almost unlimited amount of information.             Electronically Signed  By: Owens Loffler, MD On: 11/22/2013 1:27 PM ------

## 2013-11-23 NOTE — Telephone Encounter (Signed)
Ms. Gouger notified as instructed by telephone.  Metformin sent to [facility].  Follow up appointment scheduled for 03/06/2014 at 8:00am.

## 2013-12-13 ENCOUNTER — Other Ambulatory Visit: Payer: Self-pay

## 2013-12-13 MED ORDER — LEVOTHYROXINE SODIUM 88 MCG PO TABS: 88.0000 ug | ORAL_TABLET | Freq: Every day | ORAL | Status: DC

## 2013-12-13 MED ORDER — LISINOPRIL-HYDROCHLOROTHIAZIDE 10-12.5 MG PO TABS: 1.0000 | ORAL_TABLET | Freq: Every day | ORAL | Status: DC

## 2013-12-13 NOTE — Telephone Encounter (Signed)
Pt left v/m requesting refill on lisinopril and levothyroxine to [facility]; [employer] told pt she did not have refills available. spoke to pt verify pt taking lisinopril hctz 10 - 12.5 mg and levothyroxoine 88 mcg. Pt verified and notified refill sent to [facility].

## 2013-12-19 ENCOUNTER — Telehealth: Payer: Self-pay | Admitting: Family Medicine

## 2013-12-19 NOTE — Telephone Encounter (Signed)
Pt called in complaining of hip pain. Pt sees Dr. Maureen Ralphs for this but wasn't sure if she needed to see Dr. Maureen Ralphs or her PCP which is [name]. I went ahead and scheduled her for Thursday. We can cancel if need be.

## 2013-12-19 NOTE — Telephone Encounter (Signed)
i am happy to review

## 2013-12-22 ENCOUNTER — Ambulatory Visit (INDEPENDENT_AMBULATORY_CARE_PROVIDER_SITE_OTHER)
Admission: RE | Admit: 2013-12-22 | Discharge: 2013-12-22 | Disposition: A | Discharge status: Home / Self Care | Payer: Commercial Managed Care - HMO | Source: Ambulatory Visit | Attending: Family Medicine | Admitting: Family Medicine

## 2013-12-22 ENCOUNTER — Ambulatory Visit (INDEPENDENT_AMBULATORY_CARE_PROVIDER_SITE_OTHER): Payer: Commercial Managed Care - HMO | Admitting: Family Medicine

## 2013-12-22 ENCOUNTER — Encounter: Payer: Self-pay | Admitting: Family Medicine

## 2013-12-22 VITALS — BP 130/72 | HR 71 | Temp 98.2°F | Ht 65.0 in | Wt 199.5 lb

## 2013-12-22 DIAGNOSIS — M25511 Pain in right shoulder: Secondary | ICD-10-CM

## 2013-12-22 DIAGNOSIS — M25552 Pain in left hip: Secondary | ICD-10-CM

## 2013-12-22 DIAGNOSIS — M1612 Unilateral primary osteoarthritis, left hip: Secondary | ICD-10-CM

## 2013-12-22 MED ORDER — TRAMADOL HCL 50 MG PO TABS: 50.0000 mg | ORAL_TABLET | Freq: Four times a day (QID) | ORAL | Status: DC | PRN

## 2013-12-22 NOTE — Progress Notes (Signed)
Dr. [name] T. Francys Bolin, MD, [facility] [place], [address] Phone: [phone] Fax: [phone]  12/22/2013  Patient: Kim Cole, MRN: [id number], DOB: 1945-04-26, 68 y.o.  Primary Physician:  Owens Loffler, MD  Chief Complaint: Hip Pain and Shoulder Pain  Subjective:   Kim Cole is a 67 y.o. very pleasant female patient who presents with the following:  Left hip OA: s/p R THA by Dr. Ricki Rodriguez. LEFT pain in groin and laterally, just started doing that recently. When walking she will feel it and a constant. She is noquite active, and she is able to walk around without much difficulty, other than having some pain. She does take some ibuprofen or Aleve occasionally. She does have some significant loss of motion. Significant L hip OA.  Trimming trees and other yardwork recently, and she thinks that this may have worsened her current hip symptoms.  R shoulder pain: she has a little but of some mild shoulder pain, but that has improved recently. She is not having a painful arc of motion, she is having minimal pain with reaching across the body. Internal and external range of motion don't cause really any significant pain. She has not had any prior dislocations or fractures of the affected joint.    Past Medical History, Surgical History, Social History, Family History, Problem List, Medications, and Allergies have been reviewed and updated if relevant.  GEN: No fevers, chills. Nontoxic. Primarily MSK c/o today. MSK: Detailed in the HPI GI: tolerating PO intake without difficulty Neuro: No numbness, parasthesias, or tingling associated. Otherwise the pertinent positives of the ROS are noted above.   Objective:   BP 130/72  Pulse 71  Temp(Src) 98.2 F (36.8 C) (Oral)  Ht 5\' 5"  (1.651 m)  Wt 199 lb 8 oz (90.493 kg)  BMI 33.20 kg/m2   GEN: WDWN, NAD, Non-toxic, Alert & Oriented x 3 HEENT: Atraumatic,  Normocephalic.  Ears and Nose: No external deformity. EXTR: No clubbing/cyanosis/edema NEURO: Normal gait.  PSYCH: Normally interactive. Conversant. Not depressed or anxious appearing.  Calm demeanor.   Shoulder: R Inspection: No muscle wasting or winging Ecchymosis/edema: neg  AC joint, scapula, clavicle: NT Cervical spine: NT, full ROM Spurling's: neg Abduction: full, 5/5 Flexion: full, 5/5 IR, full, lift-off: 5/5 ER at neutral: full, 5/5 AC crossover and compression: neg Neer: neg Hawkins: neg Drop Test: neg Empty Can: neg Supraspinatus insertion: NT Bicipital groove: NT Speed's: neg Yergason's: neg Sulcus sign: neg Scapular dyskinesis: none C5-T1 intact Sensation intact Grip 5/5    HIP EXAM: SIDE: LEFT ROM: Abduction, Flexion, Internal and External range of motion: abduction is quite limited.She has only approximately 25 of total rotational movement. Pain with terminal IROM and EROM: mild only. GTB: NT SLR: NEG Knees: No effusion FABER: NT REVERSE FABER: NT, neg Piriformis: NT at direct palpation Str: flexion: 5/5 abduction: 5/5 adduction: 5/5 Strength testing non-tender     Radiology: Dg Shoulder Right  12/23/2013   CLINICAL DATA:  Acute onset generalized pain  EXAM: RIGHT SHOULDER - 2+ VIEW  COMPARISON:  None.  FINDINGS: Frontal, Y scapular, and axillary images were obtained. There is bony overgrowth of the acromioclavicular joint with osteoarthritic change. Glenohumeral joint appears normal. No fracture or dislocation. No erosive change. No intra-articular calcification.  IMPRESSION: Osteoarthritic change in the acromioclavicular joint region with bony overgrowth inferiorly. No fracture or dislocation.   Electronically Signed   By: Lowella Grip M.D.   On:  12/23/2013 08:37   The radiological images were independently reviewed by myself in the office and results were reviewed with the patient. My independent interpretation of images:  Mild oa of the ac  joint, but GH joint is well preserved. Electronically Signed  By: Owens Loffler, MD On: 12/23/2013 12:49 PM   Dg Hip Complete Left  12/23/2013   CLINICAL DATA:  Left hip pain without trauma  EXAM: LEFT HIP - COMPLETE 2+ VIEW  COMPARISON:  None.  FINDINGS: Changes consistent with right hip replacement are noted. Significant degenerative changes of left hip joint are noted with narrowing both superiorly and medially. Mild subchondral sclerosis and cyst formation is noted in the femoral head. There is also some mild remodeling of the femoral head. No acute fracture is seen.  IMPRESSION: Significant degenerative change.  No acute abnormality is noted.   Electronically Signed   By: Inez Catalina M.D.   On: 12/23/2013 08:39    Assessment and Plan:   Primary osteoarthritis of left hip  Hip pain, left - Plan: DG Hip Complete Left  Shoulder pain, right - Plan: DG Shoulder Right  I am not concerned about her shoulder at all.  LEFT hip has advanced degenerative changes, but the patient is quitefunctional right now. Recommended that she take Aleve 2 tablets p.o. B.i.d. Over the next 2 weeks, then do this on an as needed basis. She can also add in some Tylenol and tramadol as needed also.  Ultimately, she may need total hip arthroplasty, but as long as her function is maintained and she is able to tolerate her pain level, Ithink that she has some time before this would need to be done.  Follow-up: No Follow-up on file.  New Prescriptions   TRAMADOL (ULTRAM) 50 MG TABLET    Take 1 tablet (50 mg total) by mouth every 6 (six) hours as needed.   Orders Placed This Encounter  Procedures  . DG Hip Complete Left  . DG Shoulder Right    Signed,  [name] T. Meshelle Holness, MD   Patient's Medications  New Prescriptions   TRAMADOL (ULTRAM) 50 MG TABLET    Take 1 tablet (50 mg total) by mouth every 6 (six) hours as needed.  Previous Medications   CHOLECALCIFEROL (VITAMIN D) 1000 UNITS TABLET    Take 1,000  Units by mouth daily.   LEVOTHYROXINE (SYNTHROID, LEVOTHROID) 88 MCG TABLET    Take 1 tablet (88 mcg total) by mouth daily.   LISINOPRIL-HYDROCHLOROTHIAZIDE (PRINZIDE,ZESTORETIC) 10-12.5 MG PER TABLET    Take 1 tablet by mouth daily.   MISC NATURAL PRODUCTS (GLUCOSAMINE CHONDROITIN VIT D3) CAPS    Take by mouth.   MULTIPLE VITAMINS-MINERALS (EYE VITAMINS) TABS    Take 1 tablet by mouth daily.   SIMVASTATIN (ZOCOR) 20 MG TABLET    Take 1 tablet (20 mg total) by mouth every evening.   VITAMIN B-12 (CYANOCOBALAMIN) 1000 MCG TABLET    Take 1,000 mcg by mouth daily.   VITAMIN E 400 UNIT CAPSULE    Take 400 Units by mouth daily.  Modified Medications   Modified Medication Previous Medication   METFORMIN (GLUCOPHAGE) 500 MG TABLET metFORMIN (GLUCOPHAGE) 500 MG tablet      Take 1,000 mg by mouth at bedtime.    Take 1 tablet (500 mg total) by mouth 2 (two) times daily with a meal.  Discontinued Medications   No medications on file

## 2013-12-22 NOTE — Progress Notes (Signed)
Pre visit review using our clinic review tool, if applicable. No additional management support is needed unless otherwise documented below in the visit note. 

## 2013-12-26 ENCOUNTER — Telehealth: Payer: Self-pay

## 2013-12-26 NOTE — Telephone Encounter (Signed)
Joelene Millin with [facility] [address] left v/m needing verification of tramadol rx written on 12/22/13; rx was cut off original sheet. Spoke with Crystal with [facility] and did verify rx printed on 12/22/13 # 50 x 2 refill for tramadol. Crystal voiced understanding.

## 2013-12-27 ENCOUNTER — Other Ambulatory Visit: Payer: Self-pay | Admitting: *Deleted

## 2013-12-27 ENCOUNTER — Other Ambulatory Visit: Payer: Self-pay

## 2013-12-27 MED ORDER — LISINOPRIL-HYDROCHLOROTHIAZIDE 10-12.5 MG PO TABS: 1.0000 | ORAL_TABLET | Freq: Every day | ORAL | Status: DC

## 2013-12-27 MED ORDER — LEVOTHYROXINE SODIUM 88 MCG PO TABS: 88.0000 ug | ORAL_TABLET | Freq: Every day | ORAL | Status: DC

## 2013-12-27 NOTE — Telephone Encounter (Signed)
Appears refills were sent to [facility] [address] instead of [place]. rxs have been resent to [facility] at [place] will d/c rxs.

## 2013-12-27 NOTE — Telephone Encounter (Signed)
Pt left v/m requesting refill lisinopril-HCTZ to [facility]. Advised done.

## 2013-12-29 ENCOUNTER — Other Ambulatory Visit: Payer: Self-pay

## 2013-12-29 MED ORDER — LISINOPRIL-HYDROCHLOROTHIAZIDE 10-12.5 MG PO TABS: 1.0000 | ORAL_TABLET | Freq: Every day | ORAL | Status: DC

## 2013-12-29 NOTE — Telephone Encounter (Addendum)
Pt left v/m requesting 30 day refill of lisinopril-HCTZ to [facility] [address]. While waiting on mail order med; pt has one pill left.advised pt done. Pt having problems getting refills from [facility] mail order and will try to contact our office to request refills to see if that will alleviate refill problems for pt.

## 2014-01-12 ENCOUNTER — Other Ambulatory Visit: Payer: Self-pay

## 2014-01-12 ENCOUNTER — Telehealth: Payer: Self-pay | Admitting: Family Medicine

## 2014-01-12 MED ORDER — METFORMIN HCL 500 MG PO TABS: 1000.0000 mg | ORAL_TABLET | Freq: Every day | ORAL | Status: DC

## 2014-01-12 NOTE — Telephone Encounter (Signed)
Pt request refill metformin to [facility] advised pt done. Pt will bring handicap placard form to office to be completed; pt not sure what day will bring form.

## 2014-01-12 NOTE — Telephone Encounter (Signed)
Is there a message to go with this??

## 2014-01-12 NOTE — Telephone Encounter (Signed)
Pt dropped off form for handicap permit. Will put inbox.

## 2014-03-06 ENCOUNTER — Ambulatory Visit: Payer: Commercial Managed Care - HMO | Admitting: Family Medicine

## 2014-03-08 ENCOUNTER — Encounter: Payer: Self-pay | Admitting: Family Medicine

## 2014-03-08 ENCOUNTER — Ambulatory Visit (INDEPENDENT_AMBULATORY_CARE_PROVIDER_SITE_OTHER): Payer: Commercial Managed Care - HMO | Admitting: Family Medicine

## 2014-03-08 VITALS — BP 130/70 | HR 88 | Temp 98.4°F | Ht 65.0 in | Wt 204.8 lb

## 2014-03-08 DIAGNOSIS — E119 Type 2 diabetes mellitus without complications: Secondary | ICD-10-CM

## 2014-03-08 NOTE — Progress Notes (Signed)
Pre visit review using our clinic review tool, if applicable. No additional management support is needed unless otherwise documented below in the visit note. 

## 2014-03-08 NOTE — Progress Notes (Signed)
Dr. [name] T. Tumeka Chimenti, MD, [facility] and Sports Medicine [place], [address] Phone: [phone] Fax: [phone]  03/08/2014  Patient: Kim Cole, MRN: [id number], DOB: [birth year], 68 y.o.  Primary Physician:  Owens Loffler, MD  Chief Complaint: Diabetes  Subjective:   Kim Cole is a 68 y.o. very pleasant female patient who presents with the following:  DM: The patient is very well-known to me, she has had diabetes which was diet controlled for some time.  On her last office visit, her hemoglobin A1c was 7.9 and we decided to start her on medication.  She has been on metformin immediately release 500 mg tablets.  I initially placed her on this twice a day, and then she changed to taking all this at nighttime.  She has not had any side effects, nauseousness, low blood sugars, and she has been feeling perfectly fine.  Past Medical History, Surgical History, Social History, Family History, Problem List, Medications, and Allergies have been reviewed and updated if relevant.   GEN: No acute illnesses, no fevers, chills. GI: No n/v/d, eating normally Pulm: No SOB Interactive and getting along well at home.  Otherwise, ROS is as per the HPI.  Objective:   BP 130/70 mmHg  Pulse 88  Temp(Src) 98.4 F (36.9 C) (Oral)  Ht 5\' 5"  (1.651 m)  Wt 204 lb 12 oz (92.874 kg)  BMI 34.07 kg/m2  GEN: WDWN, NAD, Non-toxic, A & O x 3 HEENT: Atraumatic, Normocephalic. Neck supple. No masses, No LAD. Ears and Nose: No external deformity. CV: RRR, No M/G/R. No JVD. No thrill. No extra heart sounds. PULM: CTA B, no wheezes, crackles, rhonchi. No retractions. No resp. distress. No accessory muscle use. EXTR: No c/c/e NEURO Normal gait.  PSYCH: Normally interactive. Conversant. Not depressed or anxious appearing.  Calm demeanor.   Laboratory and Imaging Data: Results for orders placed or performed in visit on 41/63/84  Basic  metabolic panel  Result Value Ref Range   Sodium 138 135 - 145 mEq/L   Potassium 4.2 3.5 - 5.1 mEq/L   Chloride 101 96 - 112 mEq/L   CO2 29 19 - 32 mEq/L   Glucose, Bld 162 (H) 70 - 99 mg/dL   BUN 12 6 - 23 mg/dL   Creatinine, Ser 0.9 0.4 - 1.2 mg/dL   Calcium 10.1 8.4 - 10.5 mg/dL   GFR 65.57 >60.00 mL/min  Hemoglobin A1c  Result Value Ref Range   Hgb A1c MFr Bld 7.9 (H) 4.6 - 6.5 %  Microalbumin / creatinine urine ratio  Result Value Ref Range   Microalb, Ur 2.0 (H) 0.0 - 1.9 mg/dL   Creatinine,U 238.9 mg/dL   Microalb Creat Ratio 0.8 0.0 - 30.0 mg/g  TSH  Result Value Ref Range   TSH 2.06 0.35 - 4.50 uIU/mL     Assessment and Plan:   Diabetes type 2, controlled - Plan: Hemoglobin A1c  Split to bid dosing Check a1c  Not following diet too well.  Follow-up: 6 mo  New Prescriptions   No medications on file   Orders Placed This Encounter  Procedures  . Hemoglobin A1c    Signed,  Merrell Rettinger T. Jayra Choyce, MD   Patient's Medications  New Prescriptions   No medications on file  Previous Medications   CHOLECALCIFEROL (VITAMIN D) 1000 UNITS TABLET    Take 1,000 Units by mouth daily.   LEVOTHYROXINE (SYNTHROID, LEVOTHROID) 88 MCG TABLET    Take 1  tablet (88 mcg total) by mouth daily.   LISINOPRIL-HYDROCHLOROTHIAZIDE (PRINZIDE,ZESTORETIC) 10-12.5 MG PER TABLET    Take 1 tablet by mouth daily.   METFORMIN (GLUCOPHAGE) 500 MG TABLET    Take 2 tablets (1,000 mg total) by mouth at bedtime.   MISC NATURAL PRODUCTS (GLUCOSAMINE CHONDROITIN VIT D3) CAPS    Take by mouth.   MULTIPLE VITAMINS-MINERALS (EYE VITAMINS) TABS    Take 1 tablet by mouth daily.   SIMVASTATIN (ZOCOR) 20 MG TABLET    Take 1 tablet (20 mg total) by mouth every evening.   TRAMADOL (ULTRAM) 50 MG TABLET    Take 1 tablet (50 mg total) by mouth every 6 (six) hours as needed.   VITAMIN B-12 (CYANOCOBALAMIN) 1000 MCG TABLET    Take 1,000 mcg by mouth daily.   VITAMIN E 400 UNIT CAPSULE    Take 400 Units by mouth  daily.  Modified Medications   No medications on file  Discontinued Medications   AMOXICILLIN (AMOXIL) 500 MG CAPSULE

## 2014-03-09 ENCOUNTER — Telehealth: Payer: Self-pay | Admitting: Family Medicine

## 2014-03-09 LAB — HEMOGLOBIN A1C: Hgb A1c MFr Bld: 7.5 % — ABNORMAL HIGH (ref 4.6–6.5)

## 2014-03-09 NOTE — Telephone Encounter (Signed)
emmi emailed °

## 2014-03-10 ENCOUNTER — Encounter: Payer: Self-pay | Admitting: *Deleted

## 2014-05-29 ENCOUNTER — Ambulatory Visit (INDEPENDENT_AMBULATORY_CARE_PROVIDER_SITE_OTHER): Payer: Commercial Managed Care - HMO | Admitting: Internal Medicine

## 2014-05-29 ENCOUNTER — Encounter: Payer: Self-pay | Admitting: Internal Medicine

## 2014-05-29 VITALS — BP 132/70 | HR 77 | Temp 98.3°F | Wt 193.5 lb

## 2014-05-29 DIAGNOSIS — M545 Low back pain, unspecified: Secondary | ICD-10-CM

## 2014-05-29 DIAGNOSIS — R109 Unspecified abdominal pain: Secondary | ICD-10-CM

## 2014-05-29 DIAGNOSIS — N3289 Other specified disorders of bladder: Secondary | ICD-10-CM

## 2014-05-29 DIAGNOSIS — R3989 Other symptoms and signs involving the genitourinary system: Secondary | ICD-10-CM

## 2014-05-29 LAB — POCT URINALYSIS DIPSTICK
Bilirubin, UA: NEGATIVE
Blood, UA: NEGATIVE
Glucose, UA: NEGATIVE
Ketones, UA: NEGATIVE
Leukocytes, UA: NEGATIVE
Nitrite, UA: NEGATIVE
Protein, UA: NEGATIVE
Spec Grav, UA: >=1.03
Urobilinogen, UA: 0.2
pH, UA: 6

## 2014-05-29 NOTE — Progress Notes (Signed)
Pre visit review using our clinic review tool, if applicable. No additional management support is needed unless otherwise documented below in the visit note. 

## 2014-05-29 NOTE — Progress Notes (Signed)
HPI  Pt presents to the clinic today with c/o bladder pressure and left flank pain. She reports this started 2 weeks ago. The pain is worse with movement and certain ways that she sits. She denies any injury to the area but reports she is very active. She denies fever, chills, nausea. Tylenol helps the flank pain. She has no history of recurrent UTI's or kidney stones.   Review of Systems  Past Medical History  Diagnosis Date  . Anemia   . Arthritis   . Allergic rhinitis due to pollen   . Hyperlipidemia   . Hypertension   . Migraine   . Hypothyroid   . Diabetes mellitus type 2 in obese 11/17/2013    History reviewed. No pertinent family history.  History   Social History  . Marital Status: Married    Spouse Name: N/A  . Number of Children: N/A  . Years of Education: N/A   Occupational History  .  [employer]   Social History Main Topics  . Smoking status: Never Smoker   . Smokeless tobacco: Never Used  . Alcohol Use: Yes  . Drug Use: No  . Sexual Activity: Not on file   Other Topics Concern  . Not on file   Social History Narrative   Worked 39 years for [employer]   Married   Children and Glynda Jaeger Ritzel's mother    No Known Allergies  Constitutional: Denies fever, malaise, fatigue, headache or abrupt weight changes.   GU: Pt reports bladder pressure and left flank pain. Denies burning sensation, blood in urine, odor or discharge. Skin: Denies redness, rashes, lesions or ulcercations.   No other specific complaints in a complete review of systems (except as listed in HPI above).    Objective:   Physical Exam  BP 132/70 mmHg  Pulse 77  Temp(Src) 98.3 F (36.8 C) (Oral)  Wt 193 lb 8 oz (87.771 kg)  SpO2 97% Wt Readings from Last 3 Encounters:  05/29/14 193 lb 8 oz (87.771 kg)  03/08/14 204 lb 12 oz (92.874 kg)  12/22/13 199 lb 8 oz (90.493 kg)    General: Appears her stated age,  well developed, well nourished in NAD. Cardiovascular: Normal rate and rhythm. S1,S2 noted.  No murmur, rubs or gallops noted.  Pulmonary/Chest: Normal effort and positive vesicular breath sounds. No respiratory distress. No wheezes, rales or ronchi noted.  Abdomen: Soft and nontender. Normal bowel sounds, no bruits noted. No distention or masses noted. Liver, spleen and kidneys non palpable. No CVA tenderness. MSK: Pain reproduced with palpation. Normal flexion and extension of the lumbar spine. No pain with palpation of the spine.     Assessment & Plan:   Bladder Pressure, Left Flank Pain  Urinalysis: trace protein No need to send urine culture Drink plenty of fluids  MSK back pain:  Advised her to try Ibuprofen instead of Tylenol Stretching exercises given A heating pad may be helpful  RTC as needed or if symptoms persist.

## 2014-05-29 NOTE — Progress Notes (Signed)
Subjective:    Patient ID: Kim Cole, female    DOB: [birth year], 69 y.o.   MRN: [id number]  HPI Kim Cole is a 69 year old female who presents today with chief complaint of left flank pain.  Pain began 2 weeks ago and she describes the pain as sore, but becomes sharp with movement.  She has taken tylenol with relief.  She denies any injury or doing anything out of the ordinary.  She does sit all day at work, and uses lumbar support in her chair.      Review of Systems  Constitutional: Negative for fever, chills and fatigue.  HENT: Negative.   Respiratory: Negative for cough and shortness of breath.   Cardiovascular: Negative for chest pain, palpitations and leg swelling.  Gastrointestinal: Negative for abdominal pain, diarrhea and constipation.  Genitourinary: Positive for flank pain. Negative for urgency, frequency and difficulty urinating.  Musculoskeletal: Positive for back pain. Negative for joint swelling and gait problem.  Skin: Negative for color change, pallor, rash and wound.   Past Medical History  Diagnosis Date  . Anemia   . Arthritis   . Allergic rhinitis due to pollen   . Hyperlipidemia   . Hypertension   . Migraine   . Hypothyroid   . Diabetes mellitus type 2 in obese 11/17/2013   History reviewed. No pertinent family history. Current Outpatient Prescriptions on File Prior to Visit  Medication Sig Dispense Refill  . cholecalciferol (VITAMIN D) 1000 UNITS tablet Take 1,000 Units by mouth daily.    Marland Kitchen levothyroxine (SYNTHROID, LEVOTHROID) 88 MCG tablet Take 1 tablet (88 mcg total) by mouth daily. 90 tablet 1  . lisinopril-hydrochlorothiazide (PRINZIDE,ZESTORETIC) 10-12.5 MG per tablet Take 1 tablet by mouth daily. 30 tablet 0  . metFORMIN (GLUCOPHAGE) 500 MG tablet Take 2 tablets (1,000 mg total) by mouth at bedtime. 180 tablet 1  . Misc Natural Products (GLUCOSAMINE CHONDROITIN VIT D3) CAPS Take by mouth.    . Multiple Vitamins-Minerals (EYE VITAMINS)  TABS Take 1 tablet by mouth daily.    . simvastatin (ZOCOR) 20 MG tablet Take 1 tablet (20 mg total) by mouth every evening. 90 tablet 1  . traMADol (ULTRAM) 50 MG tablet Take 1 tablet (50 mg total) by mouth every 6 (six) hours as needed. 50 tablet 2  . vitamin B-12 (CYANOCOBALAMIN) 1000 MCG tablet Take 1,000 mcg by mouth daily.    . vitamin E 400 UNIT capsule Take 400 Units by mouth daily.     No current facility-administered medications on file prior to visit.       Objective:   Physical Exam  Constitutional: She is oriented to person, place, and time. She appears well-developed and well-nourished.  HENT:  Head: Normocephalic and atraumatic.  Neck: Normal range of motion. Neck supple.  Cardiovascular: Normal rate, regular rhythm and normal heart sounds.   Pulmonary/Chest: Effort normal and breath sounds normal.  Abdominal: Soft. Bowel sounds are normal.  Genitourinary:  Negative for CVA tenderness  Musculoskeletal: Normal range of motion.  Left flank pain.  Lymphadenopathy:    She has no cervical adenopathy.  Neurological: She is alert and oriented to person, place, and time.  Skin: Skin is warm and dry.    BP 132/70 mmHg  Pulse 77  Temp(Src) 98.3 F (36.8 C) (Oral)  Wt 193 lb 8 oz (87.771 kg)  SpO2 97%       Assessment & Plan:  1. Musculoskeletal pain - Pain in the left lower back.  Instructed patient not to lift anything greater than 5 pounds for 2 weeks.  Try to rest.  Can take ibuprofen as needed for pain.  Call office if pain persists or gets worse.

## 2014-05-29 NOTE — Patient Instructions (Signed)
Back Exercises These exercises may help you when beginning to rehabilitate your injury. Your symptoms may resolve with or without further involvement from your physician, physical therapist or athletic trainer. While completing these exercises, remember:   Restoring tissue flexibility helps normal motion to return to the joints. This allows healthier, less painful movement and activity.  An effective stretch should be held for at least 30 seconds.  A stretch should never be painful. You should only feel a gentle lengthening or release in the stretched tissue. STRETCH - Extension, Prone on Elbows   Lie on your stomach on the floor, a bed will be too soft. Place your palms about shoulder width apart and at the height of your head.  Place your elbows under your shoulders. If this is too painful, stack pillows under your chest.  Allow your body to relax so that your hips drop lower and make contact more completely with the floor.  Hold this position for __________ seconds.  Slowly return to lying flat on the floor. Repeat __________ times. Complete this exercise __________ times per day.  RANGE OF MOTION - Extension, Prone Press Ups   Lie on your stomach on the floor, a bed will be too soft. Place your palms about shoulder width apart and at the height of your head.  Keeping your back as relaxed as possible, slowly straighten your elbows while keeping your hips on the floor. You may adjust the placement of your hands to maximize your comfort. As you gain motion, your hands will come more underneath your shoulders.  Hold this position __________ seconds.  Slowly return to lying flat on the floor. Repeat __________ times. Complete this exercise __________ times per day.  RANGE OF MOTION- Quadruped, Neutral Spine   Assume a hands and knees position on a firm surface. Keep your hands under your shoulders and your knees under your hips. You may place padding under your knees for  comfort.  Drop your head and point your tail bone toward the ground below you. This will round out your low back like an angry cat. Hold this position for __________ seconds.  Slowly lift your head and release your tail bone so that your back sags into a large arch, like an old horse.  Hold this position for __________ seconds.  Repeat this until you feel limber in your low back.  Now, find your "sweet spot." This will be the most comfortable position somewhere between the two previous positions. This is your neutral spine. Once you have found this position, tense your stomach muscles to support your low back.  Hold this position for __________ seconds. Repeat __________ times. Complete this exercise __________ times per day.  STRETCH - Flexion, Single Knee to Chest   Lie on a firm bed or floor with both legs extended in front of you.  Keeping one leg in contact with the floor, bring your opposite knee to your chest. Hold your leg in place by either grabbing behind your thigh or at your knee.  Pull until you feel a gentle stretch in your low back. Hold __________ seconds.  Slowly release your grasp and repeat the exercise with the opposite side. Repeat __________ times. Complete this exercise __________ times per day.  STRETCH - Hamstrings, Standing  Stand or sit and extend your right / left leg, placing your foot on a chair or foot stool  Keeping a slight arch in your low back and your hips straight forward.  Lead with your chest and   lean forward at the waist until you feel a gentle stretch in the back of your right / left knee or thigh. (When done correctly, this exercise requires leaning only a small distance.)  Hold this position for __________ seconds. Repeat __________ times. Complete this stretch __________ times per day. STRENGTHENING - Deep Abdominals, Pelvic Tilt   Lie on a firm bed or floor. Keeping your legs in front of you, bend your knees so they are both pointed  toward the ceiling and your feet are flat on the floor.  Tense your lower abdominal muscles to press your low back into the floor. This motion will rotate your pelvis so that your tail bone is scooping upwards rather than pointing at your feet or into the floor.  With a gentle tension and even breathing, hold this position for __________ seconds. Repeat __________ times. Complete this exercise __________ times per day.  STRENGTHENING - Abdominals, Crunches   Lie on a firm bed or floor. Keeping your legs in front of you, bend your knees so they are both pointed toward the ceiling and your feet are flat on the floor. Cross your arms over your chest.  Slightly tip your chin down without bending your neck.  Tense your abdominals and slowly lift your trunk high enough to just clear your shoulder blades. Lifting higher can put excessive stress on the low back and does not further strengthen your abdominal muscles.  Control your return to the starting position. Repeat __________ times. Complete this exercise __________ times per day.  STRENGTHENING - Quadruped, Opposite UE/LE Lift   Assume a hands and knees position on a firm surface. Keep your hands under your shoulders and your knees under your hips. You may place padding under your knees for comfort.  Find your neutral spine and gently tense your abdominal muscles so that you can maintain this position. Your shoulders and hips should form a rectangle that is parallel with the floor and is not twisted.  Keeping your trunk steady, lift your right hand no higher than your shoulder and then your left leg no higher than your hip. Make sure you are not holding your breath. Hold this position __________ seconds.  Continuing to keep your abdominal muscles tense and your back steady, slowly return to your starting position. Repeat with the opposite arm and leg. Repeat __________ times. Complete this exercise __________ times per day. Document Released:  03/28/2005 Document Revised: 06/02/2011 Document Reviewed: 06/22/2008 ExitCare Patient Information 2015 [employer]. This information is not intended to replace advice given to you by your health care provider. Make sure you discuss any questions you have with your health care provider.  

## 2014-06-23 ENCOUNTER — Other Ambulatory Visit: Payer: Self-pay | Admitting: Family Medicine

## 2014-09-11 ENCOUNTER — Other Ambulatory Visit: Payer: Self-pay | Admitting: Family Medicine

## 2014-09-18 ENCOUNTER — Encounter: Payer: Self-pay | Admitting: Family Medicine

## 2014-09-18 ENCOUNTER — Ambulatory Visit (INDEPENDENT_AMBULATORY_CARE_PROVIDER_SITE_OTHER): Payer: Commercial Managed Care - HMO | Admitting: Family Medicine

## 2014-09-18 VITALS — BP 150/70 | HR 115 | Temp 98.6°F | Ht 65.0 in | Wt 177.2 lb

## 2014-09-18 DIAGNOSIS — F4321 Adjustment disorder with depressed mood: Secondary | ICD-10-CM

## 2014-09-18 DIAGNOSIS — M5116 Intervertebral disc disorders with radiculopathy, lumbar region: Secondary | ICD-10-CM

## 2014-09-18 MED ORDER — PREDNISONE 20 MG PO TABS: ORAL_TABLET | ORAL | Status: DC

## 2014-09-18 MED ORDER — TIZANIDINE HCL 4 MG PO TABS: 4.0000 mg | ORAL_TABLET | Freq: Every evening | ORAL | Status: DC

## 2014-09-18 NOTE — Progress Notes (Signed)
Dr. [name] T. Genevieve Ritzel, MD, [facility] and Sports Medicine [place], [address] Phone: [phone] Fax: [phone]  09/18/2014  Patient: Kim Cole, MRN: [id number], DOB: 01-Apr-1945, 69 y.o.  Primary Physician:  Owens Loffler, MD  Chief Complaint: Back Pain  Subjective:   [name] is a 69 y.o. very pleasant female patient who presents with the following:  Back pain - disc, has a history of disc herniation distantly Flared up recently and noticed more with sitting and when got up this morning and severe pain in her back.  ? If did anything at the house. Tried to [employer] and grabbed him with aching.   This morning, woke up and back really hurting.  Taken 1500 mg of Tylenol since this morning.   Tingling down to calf.  Left posterior of leg.  Pain in the posterior of buttocks  No bowel or bladder incontinence  Past Medical History, Surgical History, Social History, Family History, Problem List, Medications, and Allergies have been reviewed and updated if relevant.  Patient Active Problem List   Diagnosis Date Noted  . Diabetes mellitus type 2 in obese 11/17/2013    Priority: High  . Hyperlipidemia     Priority: Medium  . Hypertension     Priority: Medium  . Hypothyroid     Priority: Medium  . Obesity (BMI 30-39.9) 11/22/2013  . Arthritis   . Allergic rhinitis due to pollen   . Migraine     Past Medical History  Diagnosis Date  . Anemia   . Arthritis   . Allergic rhinitis due to pollen   . Hyperlipidemia   . Hypertension   . Migraine   . Hypothyroid   . Diabetes mellitus type 2 in obese 11/17/2013    Past Surgical History  Procedure Laterality Date  . Tubal ligation    . Total hip arthroplasty Right 2011    Dr. Ricki Rodriguez    History   Social History  . Marital Status: Married    Spouse Name: N/A  . Number of Children: N/A  . Years of Education: N/A   Occupational History  .   [employer]   Social History Main Topics  . Smoking status: Never Smoker   . Smokeless tobacco: Never Used  . Alcohol Use: Yes  . Drug Use: No  . Sexual Activity: Not on file   Other Topics Concern  . Not on file   Social History Narrative   Worked 47 years for [employer]   Married   Children and Kim Cole's mother    No family history on file.  No Known Allergies  Medication list reviewed and updated in full in [facility].  GEN: No fevers, chills. Nontoxic. Primarily MSK c/o today. MSK: Detailed in the HPI GI: tolerating PO intake without difficulty Neuro: No numbness, parasthesias, or tingling associated. Otherwise the pertinent positives of the ROS are noted above.   Objective:   BP 150/70 mmHg  Pulse 115  Temp(Src) 98.6 F (37 C) (Oral)  Ht 5\' 5"  (1.651 m)  Wt 177 lb 4 oz (80.4 kg)  BMI 29.50 kg/m2   GEN: Well-developed,well-nourished,in no acute distress; alert,appropriate and cooperative throughout examination HEENT: Normocephalic and atraumatic without obvious abnormalities. Ears, externally no deformities PULM: Breathing comfortably in no respiratory distress EXT: No clubbing, cyanosis, or edema PSYCH: Normally interactive. Cooperative during the interview. Pleasant. Friendly and conversant. Not anxious  or depressed appearing. Normal, full affect.  Range of motion at  the waist:  Flexion: limited to 70 deg Extension: normal Lateral bending: normal Rotation: all normal  No echymosis or edema Rises to examination table with no difficulty Gait: non antalgic  Inspection/Deformity: N Paraspinus Tenderness: L3-s1  B Ankle Dorsiflexion (L5,4): 5/5 B Great Toe Dorsiflexion (L5,4): 5/5 Heel Walk (L5): WNL Toe Walk (S1): WNL Rise/Squat (L4): WNL  SENSORY B Medial Foot (L4): WNL B Dorsum (L5): WNL B Lateral (S1): WNL Light Touch: WNL Pinprick: WNL  REFLEXES Knee  (L4): 2+ Ankle (S1): 2+  B SLR, seated: neg B SLR, supine: neg B FABER: neg B Reverse FABER: neg B Greater Troch: NT B Log Roll: neg B Stork: NT B Sciatic Notch: NT   Radiology: No results found.  Assessment and Plan:   Lumbar disc herniation with radiculopathy  Grief reaction  Probable aggravation of prior injury / disc Steroids, basic rom, zanaflex prn.  Additional discussion about husband's recent passing from lung cancer  Follow-up: prn if not improved in 4 weeks  New Prescriptions   PREDNISONE (DELTASONE) 20 MG TABLET    2 tabs for 5 days, then 1 tab for 5 days   TIZANIDINE (ZANAFLEX) 4 MG TABLET    Take 1 tablet (4 mg total) by mouth Nightly.   No orders of the defined types were placed in this encounter.    Signed,  Maud Deed. Patience Nuzzo, MD   Patient's Medications  New Prescriptions   PREDNISONE (DELTASONE) 20 MG TABLET    2 tabs for 5 days, then 1 tab for 5 days   TIZANIDINE (ZANAFLEX) 4 MG TABLET    Take 1 tablet (4 mg total) by mouth Nightly.  Previous Medications   CHOLECALCIFEROL (VITAMIN D) 1000 UNITS TABLET    Take 1,000 Units by mouth daily.   LEVOTHYROXINE (SYNTHROID, LEVOTHROID) 88 MCG TABLET    TAKE 1 TABLET DAILY.   LISINOPRIL-HYDROCHLOROTHIAZIDE (PRINZIDE,ZESTORETIC) 10-12.5 MG PER TABLET    Take 1 tablet by mouth daily.   METFORMIN (GLUCOPHAGE) 500 MG TABLET    TAKE 2 TABLETS AT BEDTIME   MISC NATURAL PRODUCTS (GLUCOSAMINE CHONDROITIN VIT D3) CAPS    Take by mouth.   MULTIPLE VITAMINS-MINERALS (EYE VITAMINS) TABS    Take 1 tablet by mouth daily.   SIMVASTATIN (ZOCOR) 20 MG TABLET    Take 1 tablet (20 mg total) by mouth every evening.   TRAMADOL (ULTRAM) 50 MG TABLET    Take 1 tablet (50 mg total) by mouth every 6 (six) hours as needed.   VITAMIN B-12 (CYANOCOBALAMIN) 1000 MCG TABLET    Take 1,000 mcg by mouth daily.   VITAMIN E 400 UNIT CAPSULE    Take 400 Units by mouth daily.  Modified Medications   No medications on file  Discontinued  Medications   No medications on file

## 2014-09-18 NOTE — Progress Notes (Signed)
Pre visit review using our clinic review tool, if applicable. No additional management support is needed unless otherwise documented below in the visit note. 

## 2014-09-28 ENCOUNTER — Other Ambulatory Visit: Payer: Self-pay | Admitting: Family Medicine

## 2014-10-02 ENCOUNTER — Telehealth: Payer: Self-pay

## 2014-10-02 MED ORDER — PREDNISONE 20 MG PO TABS: ORAL_TABLET | ORAL | Status: DC

## 2014-10-02 NOTE — Telephone Encounter (Signed)
Ms. Chim notified as instructed by telephone.  Refill on prednisone sent in to [facility].

## 2014-10-02 NOTE — Telephone Encounter (Signed)
If still some radiating pain, i think it is reasonable to do another course of prednisone. Can keep up tylenol, heat, stretching, etc, too  Prednisone 20 mg 2 tabs po for 5 days, then 1 tab po for 5 days, #15

## 2014-10-02 NOTE — Telephone Encounter (Signed)
Pt left v/m; pt was seen 09/18/14 with back pain and pt has finished prednisone; pt was on vacation last week and returned to work on 10/02/14; back pain is much better but today pt still having some pain in back and pain radiates down leg. Pt wants to know if needs refill of prednisone or should pt continue the tylenol. Pt request [address]. [facility] [address]

## 2014-10-16 ENCOUNTER — Telehealth: Payer: Self-pay

## 2014-10-16 NOTE — Telephone Encounter (Signed)
Left a voicemail for patient to return my call, in regards to scheduling a Mammogram.  

## 2014-11-04 IMAGING — CR DG HIP (WITH OR WITHOUT PELVIS) 2-3V*L*
3 series · 3 of 3 positions shown · non-contrast
Comparison: None.

CLINICAL DATA: Left hip pain without trauma

EXAM:
LEFT HIP - COMPLETE 2+ VIEW

[view not recorded (1 of 3)]
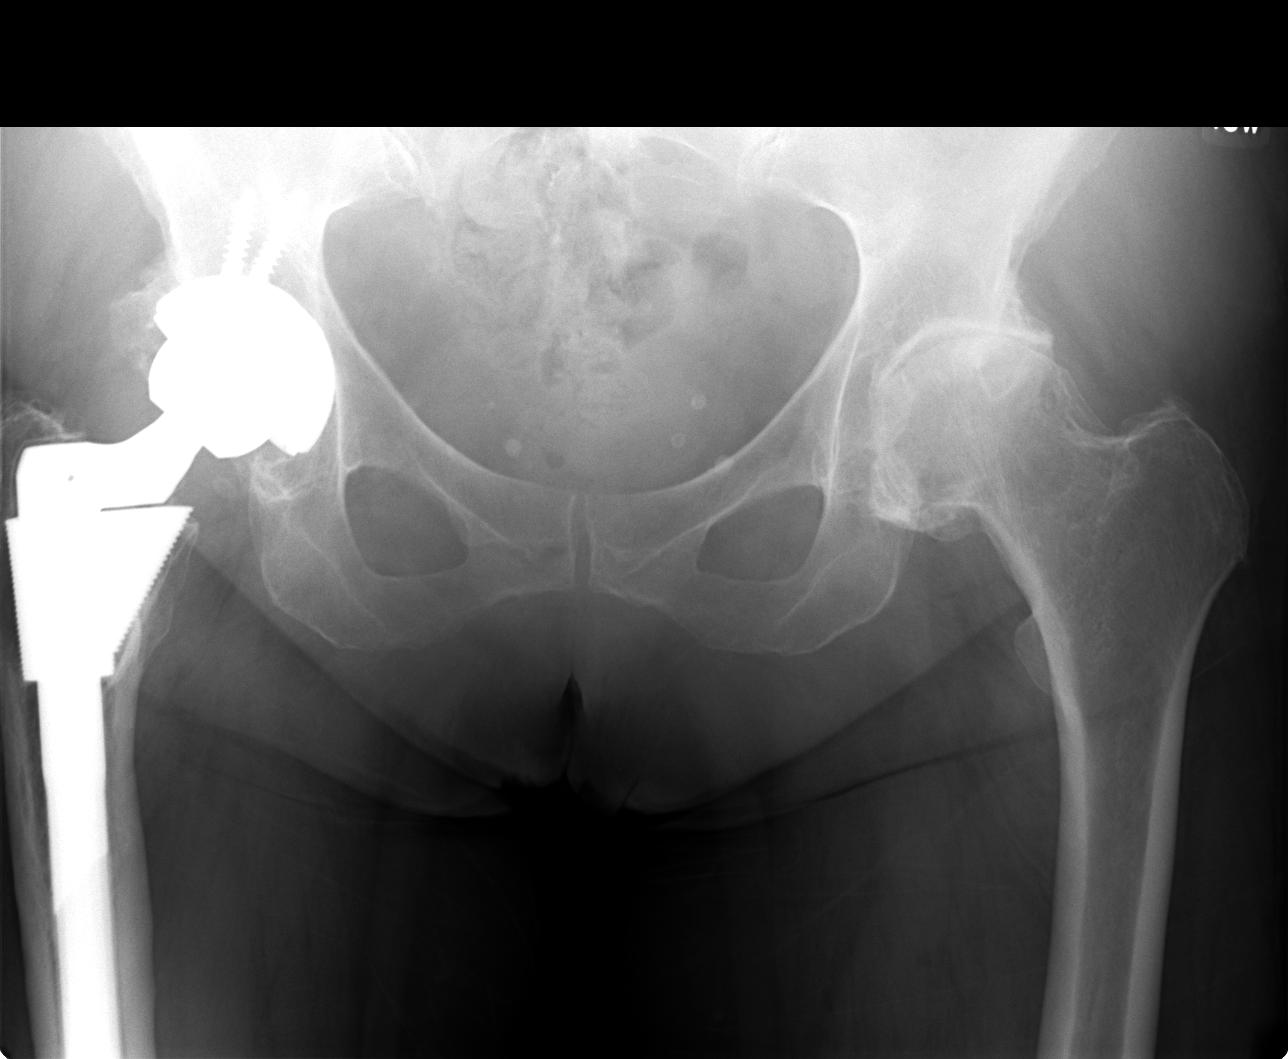

[view not recorded (2 of 3)]
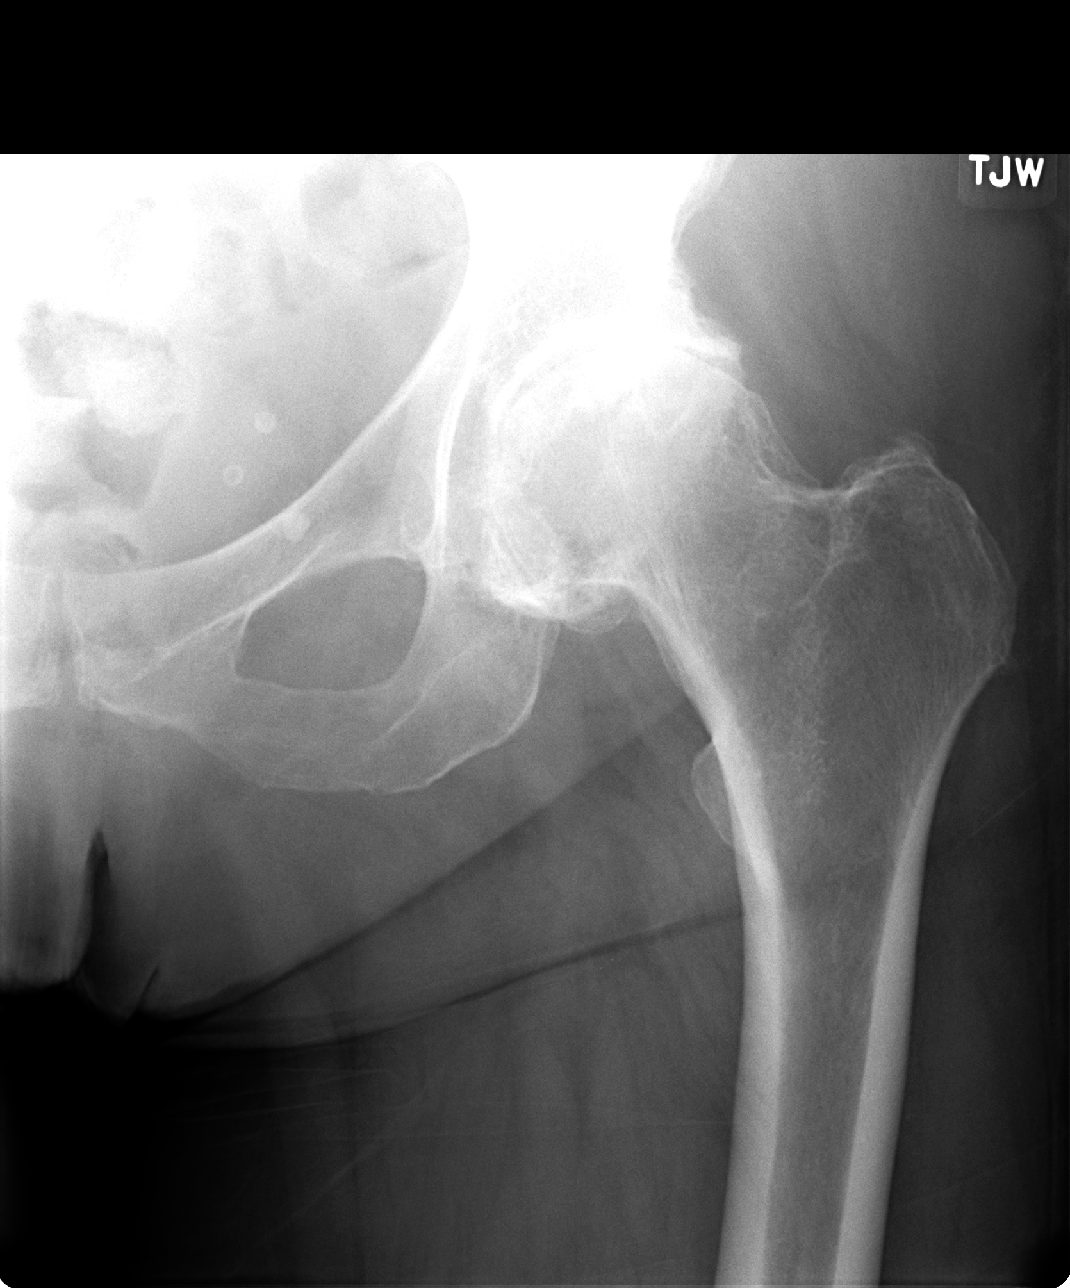

[view not recorded (3 of 3)]
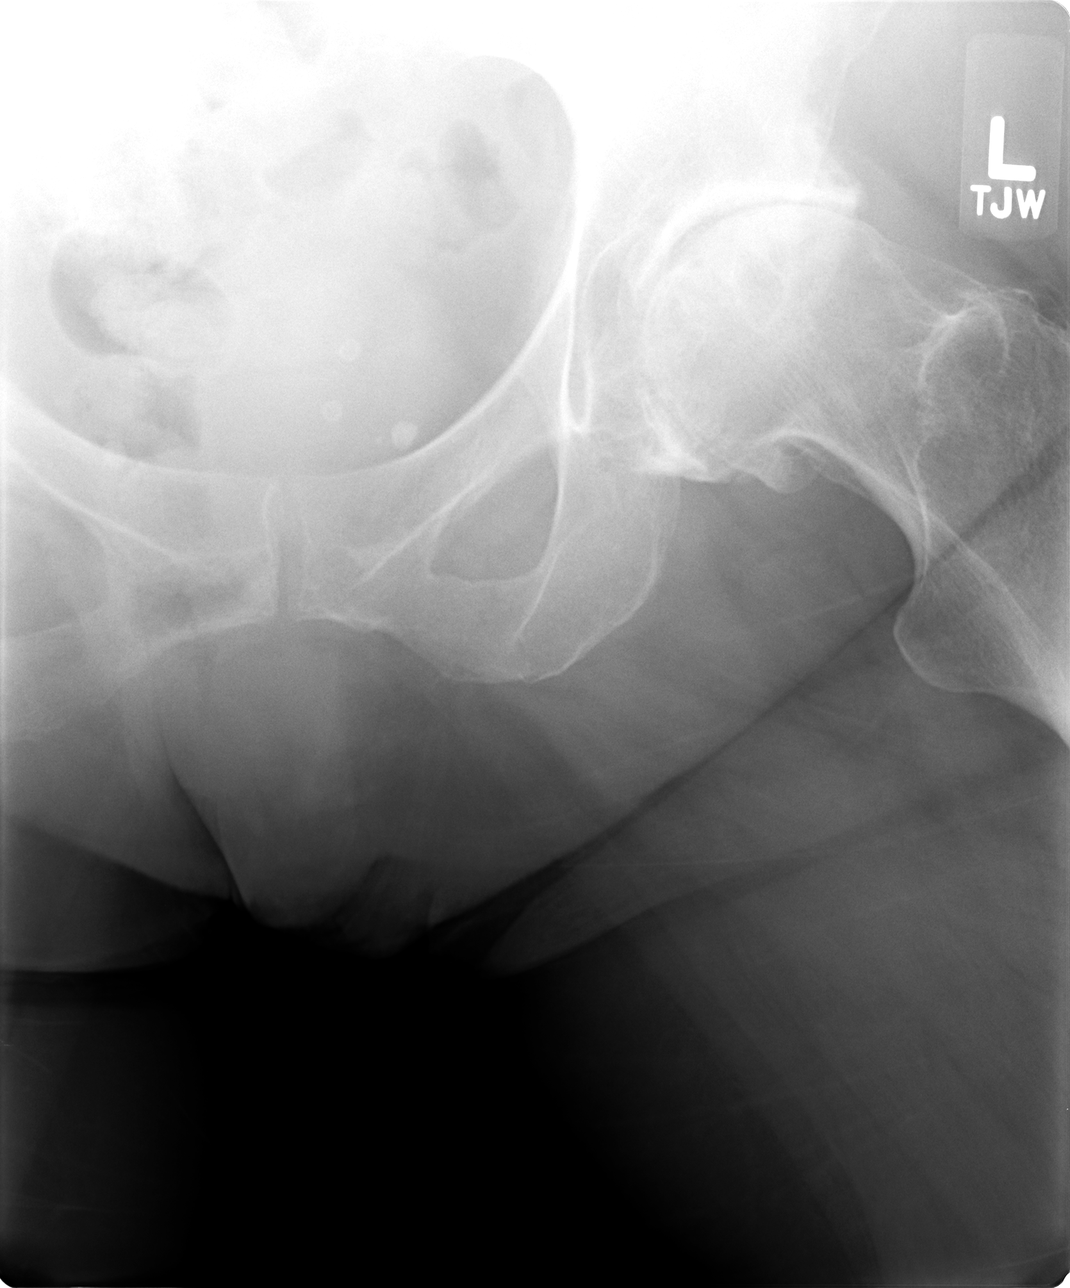

[3 of 3 positions shown; findings below may reference images not displayed]

FINDINGS: Changes consistent with right hip replacement are noted. Significant
degenerative changes of left hip joint are noted with narrowing both
superiorly and medially. Mild subchondral sclerosis and cyst
formation is noted in the femoral head. There is also some mild
remodeling of the femoral head. No acute fracture is seen.
IMPRESSION: Significant degenerative change.  No acute abnormality is noted.

## 2014-12-29 ENCOUNTER — Telehealth: Payer: Self-pay | Admitting: Family Medicine

## 2014-12-29 NOTE — Telephone Encounter (Signed)
Please call and schedule CPE with fasting labs for Dr. Lorelei Pont.

## 2014-12-31 ENCOUNTER — Other Ambulatory Visit: Payer: Self-pay | Admitting: Family Medicine

## 2015-01-11 NOTE — Telephone Encounter (Signed)
Left message asking pt to call office  °

## 2015-01-17 NOTE — Telephone Encounter (Signed)
Left message asking pt to call office  °

## 2015-01-23 NOTE — Telephone Encounter (Signed)
Left message asking pt to call office  °

## 2015-01-24 ENCOUNTER — Encounter: Payer: Self-pay | Admitting: Family Medicine

## 2015-01-24 NOTE — Telephone Encounter (Signed)
Mailed letter Please close

## 2015-01-24 NOTE — Telephone Encounter (Signed)
Pt called to schedule appointment Lab 1/3 cpx 1/5

## 2015-03-27 ENCOUNTER — Other Ambulatory Visit (INDEPENDENT_AMBULATORY_CARE_PROVIDER_SITE_OTHER): Payer: Commercial Managed Care - HMO

## 2015-03-27 DIAGNOSIS — E785 Hyperlipidemia, unspecified: Secondary | ICD-10-CM | POA: Diagnosis not present

## 2015-03-27 DIAGNOSIS — E119 Type 2 diabetes mellitus without complications: Secondary | ICD-10-CM | POA: Diagnosis not present

## 2015-03-27 DIAGNOSIS — Z79899 Other long term (current) drug therapy: Secondary | ICD-10-CM | POA: Diagnosis not present

## 2015-03-27 DIAGNOSIS — E039 Hypothyroidism, unspecified: Secondary | ICD-10-CM

## 2015-03-27 LAB — CBC WITH DIFFERENTIAL/PLATELET
Basophils Absolute: 0 10*3/uL (ref 0.0–0.1)
Basophils Relative: 0.5 % (ref 0.0–3.0)
Eosinophils Absolute: 0.1 10*3/uL (ref 0.0–0.7)
Eosinophils Relative: 1.5 % (ref 0.0–5.0)
HCT: 41.2 % (ref 36.0–46.0)
Hemoglobin: 13.6 g/dL (ref 12.0–15.0)
Lymphocytes Relative: 32.8 % (ref 12.0–46.0)
Lymphs Abs: 1.4 10*3/uL (ref 0.7–4.0)
MCHC: 33 g/dL (ref 30.0–36.0)
MCV: 88.5 fl (ref 78.0–100.0)
Monocytes Absolute: 0.3 10*3/uL (ref 0.1–1.0)
Monocytes Relative: 7.7 % (ref 3.0–12.0)
Neutro Abs: 2.5 10*3/uL (ref 1.4–7.7)
Neutrophils Relative %: 57.5 % (ref 43.0–77.0)
Platelets: 198 10*3/uL (ref 150.0–400.0)
RBC: 4.65 Mil/uL (ref 3.87–5.11)
RDW: 14.6 % (ref 11.5–15.5)
WBC: 4.4 10*3/uL (ref 4.0–10.5)

## 2015-03-27 LAB — T3, FREE: T3, Free: 2.8 pg/mL (ref 2.3–4.2)

## 2015-03-27 LAB — BASIC METABOLIC PANEL
BUN: 20 mg/dL (ref 6–23)
CO2: 29 mEq/L (ref 19–32)
Calcium: 9.7 mg/dL (ref 8.4–10.5)
Chloride: 104 mEq/L (ref 96–112)
Creatinine, Ser: 0.82 mg/dL (ref 0.40–1.20)
GFR: 73.64 mL/min (ref >60.00)
Glucose, Bld: 159 mg/dL — ABNORMAL HIGH (ref 70–99)
Potassium: 4 mEq/L (ref 3.5–5.1)
Sodium: 139 mEq/L (ref 135–145)

## 2015-03-27 LAB — TSH: TSH: 5.06 u[IU]/mL — ABNORMAL HIGH (ref 0.35–4.50)

## 2015-03-27 LAB — T4, FREE: Free T4: 0.85 ng/dL (ref 0.60–1.60)

## 2015-03-27 LAB — HEPATIC FUNCTION PANEL
ALT: 22 U/L (ref 0–35)
AST: 20 U/L (ref 0–37)
Albumin: 4.1 g/dL (ref 3.5–5.2)
Alkaline Phosphatase: 85 U/L (ref 39–117)
Bilirubin, Direct: 0.1 mg/dL (ref 0.0–0.3)
Total Bilirubin: 0.5 mg/dL (ref 0.2–1.2)
Total Protein: 6.6 g/dL (ref 6.0–8.3)

## 2015-03-27 LAB — HEMOGLOBIN A1C: Hgb A1c MFr Bld: 6.3 % (ref 4.6–6.5)

## 2015-03-27 LAB — LIPID PANEL
Cholesterol: 242 mg/dL — ABNORMAL HIGH (ref 0–200)
HDL: 50.6 mg/dL (ref >39.00)
LDL Cholesterol: 165 mg/dL — ABNORMAL HIGH (ref 0–99)
NonHDL: 191.12
Total CHOL/HDL Ratio: 5
Triglycerides: 132 mg/dL (ref 0.0–149.0)
VLDL: 26.4 mg/dL (ref 0.0–40.0)

## 2015-03-27 LAB — MICROALBUMIN / CREATININE URINE RATIO
Creatinine,U: 78.7 mg/dL
Microalb Creat Ratio: 0.9 mg/g (ref 0.0–30.0)
Microalb, Ur: <0.7 mg/dL (ref 0.0–1.9)

## 2015-03-29 ENCOUNTER — Ambulatory Visit (INDEPENDENT_AMBULATORY_CARE_PROVIDER_SITE_OTHER): Payer: Commercial Managed Care - HMO | Admitting: Family Medicine

## 2015-03-29 ENCOUNTER — Encounter: Payer: Self-pay | Admitting: Family Medicine

## 2015-03-29 VITALS — BP 114/62 | HR 67 | Temp 97.7°F | Ht 64.5 in | Wt 179.5 lb

## 2015-03-29 DIAGNOSIS — M8580 Other specified disorders of bone density and structure, unspecified site: Secondary | ICD-10-CM

## 2015-03-29 DIAGNOSIS — Z0000 Encounter for general adult medical examination without abnormal findings: Secondary | ICD-10-CM

## 2015-03-29 DIAGNOSIS — Z1211 Encounter for screening for malignant neoplasm of colon: Secondary | ICD-10-CM

## 2015-03-29 NOTE — Progress Notes (Signed)
Pre visit review using our clinic review tool, if applicable. No additional management support is needed unless otherwise documented below in the visit note. 

## 2015-03-29 NOTE — Progress Notes (Signed)
 Dr.  T. , MD, [facility] [address] [place], [address] Phone: [phone] Fax: [phone]  03/29/2015  Patient: Kim Cole, MRN: [id number], DOB: 08/31/1945, 69 y.o.  Primary Physician:   , MD   Chief Complaint  Patient presents with  . Annual Exam    Medicare Wellness   Subjective:   Kim Cole is a 69 y.o. pleasant patient who presents for a medicare wellness examination:  Health Maintenance Summary Reviewed and updated, unless pt declines services.  Tobacco History Reviewed. Non-smoker Alcohol: No concerns, no excessive use Exercise Habits: Some activity, rec at least 30 mins 5 times a week STD concerns: none Drug Use: None Birth control method: n/a Menses regular: n/a Lumps or breast concerns: no Breast Cancer Family History: no  mammo - next week Colon: colon at [facility] at [facility] exam - just had 2 weeks ago DEXA Flu - will think about it  Still working full time.   Wt Readings from Last 3 Encounters:  03/29/15 179 lb 8 oz (81.421 kg)  09/18/14 177 lb 4 oz (80.4 kg)  05/29/14 193 lb 8 oz (87.771 kg)    Has been forgetting medicine. Chol.  Health Maintenance  Topic Date Due  . Hepatitis C Screening  01/31/1946  . TETANUS/TDAP  01/12/1966  . DEXA SCAN  01/13/2012  . COLONOSCOPY  03/24/2014  . MAMMOGRAM  10/12/2014  . INFLUENZA VACCINE  06/22/2015 (Originally 10/23/2014)  . HEMOGLOBIN A1C  09/24/2015  . FOOT EXAM  03/31/2016  . OPHTHALMOLOGY EXAM  03/31/2016  . ZOSTAVAX  Addressed  . PNA vac Low Risk Adult  Completed   Immunization History  Administered Date(s) Administered  . Pneumococcal Conjugate-13 11/16/2013  . Pneumococcal Polysaccharide-23 10/11/2012    Patient Active Problem List   Diagnosis Date Noted  . Diabetes mellitus type 2 in obese (HCC) 11/17/2013    Priority: High  . Hyperlipidemia     Priority: Medium  . Hypertension    Priority: Medium  . Hypothyroid     Priority: Medium  . Obesity (BMI 30-39.9) 11/22/2013  . Arthritis   . Allergic rhinitis due to pollen   . Migraine    Past Medical History  Diagnosis Date  . Anemia   . Arthritis   . Allergic rhinitis due to pollen   . Hyperlipidemia   . Hypertension   . Migraine   . Hypothyroid   . Diabetes mellitus type 2 in obese (HCC) 11/17/2013   Past Surgical History  Procedure Laterality Date  . Tubal ligation    . Total hip arthroplasty Right 2011    Dr. Alucio   Social History   Social History  . Marital Status: Married    Spouse Name: N/A  . Number of Children: N/A  . Years of Education: N/A   Occupational History  .  [employer]   Social History Main Topics  . Smoking status: Never Smoker   . Smokeless tobacco: Never Used  . Alcohol Use: Yes  . Drug Use: No  . Sexual Activity: Not on file   Other Topics Concern  . Not on file   Social History Narrative   Worked 40 years for [employer] / [employer]   Married   Children and Grandchildren   Michael Bialy's mother   No family history on file. No Known Allergies  Medication list has been reviewed and updated.   General: Denies fever, chills, sweats.   No significant weight loss. Eyes: Denies blurring,significant itching ENT: Denies earache, sore throat, and hoarseness.  Cardiovascular: Denies chest pains, palpitations, dyspnea on exertion,  Respiratory: Denies cough, dyspnea at rest,wheeezing Breast: no concerns about lumps GI: Denies nausea, vomiting, diarrhea, constipation, change in bowel habits, abdominal pain, melena, hematochezia GU: Denies dysuria, hematuria, urinary hesitancy, nocturia, denies STD risk, no concerns about discharge Musculoskeletal: Denies back pain, joint pain Derm: Denies rash, itching Neuro: Denies  paresthesias, frequent falls, frequent headaches Psych: Denies depression, anxiety Endocrine: Denies cold  intolerance, heat intolerance, polydipsia Heme: Denies enlarged lymph nodes Allergy: No hayfever  Objective:   BP 114/62 mmHg  Pulse 67  Temp(Src) 97.7 F (36.5 C) (Oral)  Ht 5' 4.5" (1.638 m)  Wt 179 lb 8 oz (81.421 kg)  BMI 30.35 kg/m2  The patient completed a fall screen and PHQ-2 and PHQ-9 if necessary, which is documented in the EHR. The CMA/LPN/RN who assisted the patient verbally completed with them and documented results in [facility] EHR.   Hearing Screening   Method: Audiometry   125Hz 250Hz 500Hz 1000Hz 2000Hz 4000Hz 8000Hz  Right ear:   20 40 20 40   Left ear:   _0 40   Vision Screening Comments: Wears Glasses-Eye Exam with Dr. Matilde Sprang 02/27/2015  GEN: well developed, well nourished, no acute distress Eyes: conjunctiva and lids normal, PERRLA, EOMI ENT: TM clear, nares clear, oral exam WNL Neck: supple, no lymphadenopathy, no thyromegaly, no JVD Pulm: clear to auscultation and percussion, respiratory effort normal CV: regular rate and rhythm, S1-S2, no murmur, rub or gallop, no bruits Chest: no scars, masses, no lumps BREAST: breast exam declined GI: soft, non-tender; no hepatosplenomegaly, masses; active bowel sounds all quadrants GU: GU exam declined Lymph: no cervical, axillary or inguinal adenopathy MSK: gait normal, muscle tone and strength WNL, no joint swelling, effusions, discoloration, crepitus  SKIN: clear, good turgor, color WNL, no rashes, lesions, or ulcerations Neuro: normal mental status, normal strength, sensation, and motion Psych: alert; oriented to person, place and time, normally interactive and not anxious or depressed in appearance.   All labs reviewed with patient. Lipids:    Component Value Date/Time   CHOL 242* 03/27/2015 1009   TRIG 132.0 03/27/2015 1009   HDL 50.60 03/27/2015 1009   VLDL 26.4 03/27/2015 1009   CHOLHDL 5 03/27/2015 1009   CBC: CBC Latest Ref Rng 03/27/2015 06/21/2012 10/06/2009  WBC 4.0 - 10.5 K/uL 4.4 7.8  7.0  Hemoglobin 12.0 - 15.0 g/dL 13.6 13.8 11.0 DELTA CHECK NOTED POST TRANSFUSION SPECIMEN(L)  Hematocrit 36.0 - 46.0 % 41.2 41.0 31.9(L)  Platelets 150.0 - 400.0 K/uL 198.0 207.0 148(L)    Basic Metabolic Panel:    Component Value Date/Time   NA 139 03/27/2015 1009   K 4.0 03/27/2015 1009   CL 104 03/27/2015 1009   CO2 29 03/27/2015 1009   BUN 20 03/27/2015 1009   CREATININE 0.82 03/27/2015 1009   GLUCOSE 159* 03/27/2015 1009   CALCIUM 9.7 03/27/2015 1009   Hepatic Function Latest Ref Rng 03/27/2015 09/21/2009  Total Protein 6.0 - 8.3 g/dL 6.6 7.3  Albumin 3.5 - 5.2 g/dL 4.1 4.3  AST 0 - 37 U/L 20 26  ALT 0 - 35 U/L 22 35  Alk Phosphatase 39 - 117 U/L 85 78  Total Bilirubin 0.2 - 1.2 mg/dL 0.5 1.0  Bilirubin, Direct 0.0 - 0.3 mg/dL 0.1 -    Lab Results  Component Value Date   TSH 5.06* 03/27/2015  t3 and t4 normal  No results found.  Assessment and Plan:   Routine general medical examination at a health care facility  Special screening for malignant neoplasms, colon - Plan: Ambulatory referral to Gastroenterology  Osteopenia - Plan: DG Bone Density   Health Maintenance Exam: The patient's preventative maintenance and recommended screening tests for an annual wellness exam were reviewed in full today. Brought up to date unless services declined.  Counselled on the importance of diet, exercise, and its role in overall health and mortality. The patient's FH and SH was reviewed, including their home life, tobacco status, and drug and alcohol status.  I have personally reviewed the Medicare Annual Wellness questionnaire and have noted 1. The patient's medical and social history 2. Their use of alcohol, tobacco or illicit drugs 3. Their current medications and supplements 4. The patient's functional ability including ADL's, fall risks, home safety risks and hearing or visual             impairment. 5. Diet and physical activities 6. Evidence for depression or mood  disorders 7. Reviewed Updated provider list, see scanned forms and [facility] Snapshot.   The patients weight, height, BMI and visual acuity have been recorded in the chart I have made referrals, counseling and provided education to the patient based review of the above and I have provided the pt with a written personalized care plan for preventive services.  I have provided the patient with a copy of your personalized plan for preventive services. Instructed to take the time to review along with their updated medication list.  Overall, doing well. Coping with loss. Forgetting chol meds - restart  Follow-up: No Follow-up on file. Or follow-up in 1 year for complete physical examination  Orders Placed This Encounter  Procedures  . DG Bone Density  . Ambulatory referral to Gastroenterology    Signed,  [name] T. Pedrohenrique M[place], MD   Patient's Medications  New Prescriptions   No medications on file  Previous Medications   CHOLECALCIFEROL (VITAMIN D) 1000 UNITS TABLET    Take 1,000 Units by mouth daily.   LEVOTHYROXINE (SYNTHROID, LEVOTHROID) 88 MCG TABLET    TAKE 1 TABLET EVERY DAY   LISINOPRIL-HYDROCHLOROTHIAZIDE (PRINZIDE,ZESTORETIC) 10-12.5 MG TABLET    TAKE 1 TABLET EVERY DAY NEED TO SCHEDULE PHYSICAL EXAM   METFORMIN (GLUCOPHAGE) 500 MG TABLET    TAKE 2 TABLETS AT BEDTIME   MISC NATURAL PRODUCTS (GLUCOSAMINE CHONDROITIN VIT D3) CAPS    Take by mouth.   MULTIPLE VITAMINS-MINERALS (EYE VITAMINS) TABS    Take 1 tablet by mouth daily.   SIMVASTATIN (ZOCOR) 20 MG TABLET    Take 1 tablet (20 mg total) by mouth every evening.   TIZANIDINE (ZANAFLEX) 4 MG TABLET    Take 1 tablet (4 mg total) by mouth Nightly.   VITAMIN B-12 (CYANOCOBALAMIN) 1000 MCG TABLET    Take 1,000 mcg by mouth daily.   VITAMIN E 400 UNIT CAPSULE    Take 400 Units by mouth daily.  Modified Medications   No medications on file  Discontinued Medications   PREDNISONE (DELTASONE) 20 MG TABLET    2 tabs for 5 days, then 1 tab  for 5 days   TRAMADOL (ULTRAM) 50 MG TABLET    Take 1 tablet (50 mg total) by mouth every 6 (six) hours as needed.

## 2015-04-04 ENCOUNTER — Other Ambulatory Visit: Payer: Self-pay | Admitting: Family Medicine

## 2015-04-23 ENCOUNTER — Encounter: Payer: Self-pay | Admitting: Family Medicine

## 2015-04-26 ENCOUNTER — Encounter: Payer: Self-pay | Admitting: Family Medicine

## 2015-06-22 ENCOUNTER — Other Ambulatory Visit: Payer: Self-pay | Admitting: *Deleted

## 2015-06-22 MED ORDER — SIMVASTATIN 20 MG PO TABS: 20.0000 mg | ORAL_TABLET | Freq: Every evening | ORAL | Status: DC

## 2015-09-06 ENCOUNTER — Other Ambulatory Visit: Payer: Self-pay | Admitting: Gastroenterology

## 2015-09-07 ENCOUNTER — Encounter (HOSPITAL_COMMUNITY): Payer: Self-pay | Admitting: *Deleted

## 2015-09-09 ENCOUNTER — Other Ambulatory Visit: Payer: Self-pay | Admitting: Family Medicine

## 2015-09-10 ENCOUNTER — Encounter (HOSPITAL_COMMUNITY)
Admission: RE | Disposition: A | Discharge status: Home / Self Care | Payer: Self-pay | Source: Ambulatory Visit | Attending: Gastroenterology

## 2015-09-10 ENCOUNTER — Ambulatory Visit (HOSPITAL_COMMUNITY): Payer: Commercial Managed Care - HMO | Admitting: Anesthesiology

## 2015-09-10 ENCOUNTER — Encounter (HOSPITAL_COMMUNITY): Payer: Self-pay

## 2015-09-10 ENCOUNTER — Ambulatory Visit (HOSPITAL_COMMUNITY)
Admission: RE | Admit: 2015-09-10 | Discharge: 2015-09-10 | Disposition: A | Discharge status: Home / Self Care | Payer: Commercial Managed Care - HMO | Source: Ambulatory Visit | Attending: Gastroenterology | Admitting: Gastroenterology

## 2015-09-10 DIAGNOSIS — E669 Obesity, unspecified: Secondary | ICD-10-CM | POA: Diagnosis not present

## 2015-09-10 DIAGNOSIS — E039 Hypothyroidism, unspecified: Secondary | ICD-10-CM | POA: Diagnosis not present

## 2015-09-10 DIAGNOSIS — Z79899 Other long term (current) drug therapy: Secondary | ICD-10-CM | POA: Insufficient documentation

## 2015-09-10 DIAGNOSIS — Z1211 Encounter for screening for malignant neoplasm of colon: Secondary | ICD-10-CM | POA: Diagnosis present

## 2015-09-10 DIAGNOSIS — D122 Benign neoplasm of ascending colon: Secondary | ICD-10-CM | POA: Diagnosis not present

## 2015-09-10 DIAGNOSIS — Z6829 Body mass index (BMI) 29.0-29.9, adult: Secondary | ICD-10-CM | POA: Insufficient documentation

## 2015-09-10 DIAGNOSIS — Z86010 Personal history of colonic polyps: Secondary | ICD-10-CM | POA: Insufficient documentation

## 2015-09-10 DIAGNOSIS — I10 Essential (primary) hypertension: Secondary | ICD-10-CM | POA: Insufficient documentation

## 2015-09-10 DIAGNOSIS — E119 Type 2 diabetes mellitus without complications: Secondary | ICD-10-CM | POA: Diagnosis not present

## 2015-09-10 DIAGNOSIS — Z96641 Presence of right artificial hip joint: Secondary | ICD-10-CM | POA: Insufficient documentation

## 2015-09-10 DIAGNOSIS — Z7984 Long term (current) use of oral hypoglycemic drugs: Secondary | ICD-10-CM | POA: Insufficient documentation

## 2015-09-10 HISTORY — PX: COLONOSCOPY WITH PROPOFOL: SHX5780

## 2015-09-10 LAB — GLUCOSE, CAPILLARY: Glucose-Capillary: 157 mg/dL — ABNORMAL HIGH (ref 65–99)

## 2015-09-10 SURGERY — COLONOSCOPY WITH PROPOFOL
Anesthesia: Monitor Anesthesia Care

## 2015-09-10 MED ORDER — SODIUM CHLORIDE 0.9 % IV SOLN: INTRAVENOUS | Status: DC

## 2015-09-10 MED ORDER — LIDOCAINE HCL (CARDIAC) 20 MG/ML IV SOLN
INTRAVENOUS | Status: AC
  Filled 2015-09-10: qty 5

## 2015-09-10 MED ORDER — PROPOFOL 10 MG/ML IV BOLUS
INTRAVENOUS | Status: AC
  Filled 2015-09-10: qty 40

## 2015-09-10 MED ORDER — LACTATED RINGERS IV SOLN
INTRAVENOUS | Status: DC
  Administered 2015-09-10: 12:00:00 via INTRAVENOUS

## 2015-09-10 MED ORDER — PROPOFOL 500 MG/50ML IV EMUL
INTRAVENOUS | Status: DC | PRN
  Administered 2015-09-10: 100 ug/kg/min via INTRAVENOUS

## 2015-09-10 MED ORDER — PROPOFOL 500 MG/50ML IV EMUL
INTRAVENOUS | Status: DC | PRN
  Administered 2015-09-10: 60 mg via INTRAVENOUS

## 2015-09-10 SURGICAL SUPPLY — 22 items

## 2015-09-10 NOTE — Anesthesia Preprocedure Evaluation (Addendum)
Anesthesia Evaluation  Patient identified by MRN, date of birth, ID band Patient awake    Reviewed: Allergy & Precautions, NPO status , Patient's Chart, lab work & pertinent test results  History of Anesthesia Complications Negative for: history of anesthetic complications  Airway Mallampati: II  TM Distance: >3 FB Neck ROM: Full    Dental  (+) Teeth Intact   Pulmonary neg pulmonary ROS,    breath sounds clear to auscultation       Cardiovascular hypertension,  Rhythm:Regular Rate:Normal     Neuro/Psych  Headaches,    GI/Hepatic Neg liver ROS,   Endo/Other  diabetes  Renal/GU negative Renal ROS     Musculoskeletal  (+) Arthritis ,   Abdominal (+) + obese,   Peds  Hematology  (+) anemia ,   Anesthesia Other Findings   Reproductive/Obstetrics                           Anesthesia Physical Anesthesia Plan  ASA: III  Anesthesia Plan: MAC   Post-op Pain Management:    Induction: Intravenous  Airway Management Planned: Natural Airway and Simple Face Mask  Additional Equipment:   Intra-op Plan:   Post-operative Plan:   Informed Consent: I have reviewed the patients History and Physical, chart, labs and discussed the procedure including the risks, benefits and alternatives for the proposed anesthesia with the patient or authorized representative who has indicated his/her understanding and acceptance.   Dental advisory given  Plan Discussed with: CRNA and Surgeon  Anesthesia Plan Comments:         Anesthesia Quick Evaluation

## 2015-09-10 NOTE — H&P (Signed)
  Procedure: Surveillance colonoscopy. 08/06/2009 colonoscopy was performed with removal of two small adenomatous colon polyps  History: The patient is a 70 year old female born 1945-11-14. She is scheduled to undergo a surveillance colonoscopy today.  Past medical history: Hypertension. Toxic nodular goiter. Hypothyroidism. Lumbosacral degenerative disc disease. Osteoarthritis of the right hip. Total right hip replacement surgery performed in July 2011. Bilateral tubal ligation.  Exam: The patient is alert and lying comfortably on the endoscopy stretcher. Abdomen is soft and nontender to palpation. Lungs are clear to auscultation. Cardiac exam reveals a regular rhythm.  Plan: Proceed with surveillance colonoscopy

## 2015-09-10 NOTE — Transfer of Care (Signed)
Immediate Anesthesia Transfer of Care Note  Patient: Kim Cole  Procedure(s) Performed: Procedure(s): COLONOSCOPY WITH PROPOFOL (N/A)  Patient Location: PACU  Anesthesia Type:MAC  Level of Consciousness: awake, alert  and oriented  Airway & Oxygen Therapy: Patient Spontanous Breathing and Patient connected to face mask oxygen  Post-op Assessment: Report given to RN and Post -op Vital signs reviewed and stable  Post vital signs: Reviewed and stable  Last Vitals:  Filed Vitals:   09/10/15 1129  BP: 157/59  Temp: 36.7 C  Resp: 15    Last Pain: There were no vitals filed for this visit.       Complications: No apparent anesthesia complications

## 2015-09-10 NOTE — Discharge Instructions (Signed)
Colonoscopy, Care After °These instructions give you information on caring for yourself after your procedure. Your doctor may also give you more specific instructions. Call your doctor if you have any problems or questions after your procedure. °HOME CARE °· Do not drive for 24 hours. °· Do not sign important papers or use machinery for 24 hours. °· You may shower. °· You may go back to your usual activities, but go slower for the first 24 hours. °· Take rest breaks often during the first 24 hours. °· Walk around or use warm packs on your belly (abdomen) if you have belly cramping or gas. °· Drink enough fluids to keep your pee (urine) clear or pale yellow. °· Resume your normal diet. Avoid heavy or fried foods. °· Avoid drinking alcohol for 24 hours or as told by your doctor. °· Only take medicines as told by your doctor. °If a tissue sample (biopsy) was taken during the procedure:  °· Do not take aspirin or blood thinners for 7 days, or as told by your doctor. °· Do not drink alcohol for 7 days, or as told by your doctor. °· Eat soft foods for the first 24 hours. °GET HELP IF: °You still have a small amount of blood in your poop (stool) 2-3 days after the procedure. °GET HELP RIGHT AWAY IF: °· You have more than a small amount of blood in your poop. °· You see clumps of tissue (blood clots) in your poop. °· Your belly is puffy (swollen). °· You feel sick to your stomach (nauseous) or throw up (vomit). °· You have a fever. °· You have belly pain that gets worse and medicine does not help. °MAKE SURE YOU: °· Understand these instructions. °· Will watch your condition. °· Will get help right away if you are not doing well or get worse. °  °This information is not intended to replace advice given to you by your health care provider. Make sure you discuss any questions you have with your health care provider. °  °Document Released: 04/12/2010 Document Revised: 03/15/2013 Document Reviewed: 11/15/2012 °Elsevier  Interactive Patient Education ©2016 [employer] ° ° °Monitored Anesthesia Care °Monitored anesthesia care is an anesthesia service for a medical procedure. Anesthesia is the loss of the ability to feel pain. It is produced by medicines called anesthetics. It may affect a small area of your body (local anesthesia), a large area of your body (regional anesthesia), or your entire body (general anesthesia). The need for monitored anesthesia care depends your procedure, your condition, and the potential need for regional or general anesthesia. It is often provided during procedures where:  °· General anesthesia may be needed if there are complications. This is because you need special care when you are under general anesthesia.   °· You will be under local or regional anesthesia. This is so that you are able to have higher levels of anesthesia if needed.   °· You will receive calming medicines (sedatives). This is especially the case if sedatives are given to put you in a semi-conscious state of relaxation (deep sedation). This is because the amount of sedative needed to produce this state can be hard to predict. Too much of a sedative can produce general anesthesia. °Monitored anesthesia care is performed by one or more health care providers who have special training in all types of anesthesia. You will need to meet with these health care providers before your procedure. During this meeting, they will ask you about your medical history. They will   also give you instructions to follow. (For example, you will need to stop eating and drinking before your procedure. You may also need to stop or change medicines you are taking.) During your procedure, your health care providers will stay with you. They will:  °· Watch your condition. This includes watching your blood pressure, breathing, and level of pain.   °· Diagnose and treat problems that occur.   °· Give medicines if they are needed. These may include calming  medicines (sedatives) and anesthetics.   °· Make sure you are comfortable.   °Having monitored anesthesia care does not necessarily mean that you will be under anesthesia. It does mean that your health care providers will be able to manage anesthesia if you need it or if it occurs. It also means that you will be able to have a different type of anesthesia than you are having if you need it. When your procedure is complete, your health care providers will continue to watch your condition. They will make sure any medicines wear off before you are allowed to go home.  °  °This information is not intended to replace advice given to you by your health care provider. Make sure you discuss any questions you have with your health care provider. °  °Document Released: 12/04/2004 Document Revised: 03/31/2014 Document Reviewed: 04/21/2012 °Elsevier Interactive Patient Education ©2016 Elsevier Inc. ° °

## 2015-09-10 NOTE — Anesthesia Postprocedure Evaluation (Signed)
Anesthesia Post Note  Patient: Kim Cole  Procedure(s) Performed: Procedure(s) (LRB): COLONOSCOPY WITH PROPOFOL (N/A)  Patient location during evaluation: Endoscopy Anesthesia Type: MAC Level of consciousness: awake and alert Pain management: pain level controlled Vital Signs Assessment: post-procedure vital signs reviewed and stable Respiratory status: spontaneous breathing, nonlabored ventilation, respiratory function stable and patient connected to nasal cannula oxygen Cardiovascular status: stable and blood pressure returned to baseline Anesthetic complications: no    Last Vitals:  Filed Vitals:   09/10/15 1320 09/10/15 1330  BP: 122/67 143/70  Pulse: 69 68  Temp:    Resp: 16 18    Last Pain: There were no vitals filed for this visit.               Sakshi Sermons,JAMES TERRILL

## 2015-09-10 NOTE — Op Note (Addendum)
[facility] Patient Name: Kim Cole Procedure Date: 09/10/2015 MRN: [id number] Attending MD: Garlan Fair , MD Date of Birth: 1946/01/20 CSN: [id number] Age: 70 Admit Type: Outpatient Procedure:                Colonoscopy Indications:              High risk colon cancer surveillance: Personal                            history of adenoma less than 10 mm in size Providers:                Garlan Fair, MD, Malka So, RN, Cherylynn Ridges, Technician, Rosario Adie, CRNA Referring MD:              Medicines:                Propofol per Anesthesia Complications:            No immediate complications. Estimated Blood Loss:     Estimated blood loss: none. Procedure:                Pre-Anesthesia Assessment:                           - Prior to the procedure, a History and Physical                            was performed, and patient medications and                            allergies were reviewed. The patient's tolerance of                            previous anesthesia was also reviewed. The risks                            and benefits of the procedure and the sedation                            options and risks were discussed with the patient.                            All questions were answered, and informed consent                            was obtained. Prior Anticoagulants: The patient has                            taken aspirin, last dose was 1 day prior to                            procedure. ASA Grade Assessment: II - A patient  with mild systemic disease. After reviewing the                            risks and benefits, the patient was deemed in                            satisfactory condition to undergo the procedure.                           After obtaining informed consent, the colonoscope                            was passed under direct vision. Throughout the                             procedure, the patient's blood pressure, pulse, and                            oxygen saturations were monitored continuously. The                            [id number]) scope was introduced through                            the anus and advanced to the the cecum, identified                            by appendiceal orifice and ileocecal valve. The                            colonoscopy was performed without difficulty. The                            patient tolerated the procedure well. The quality                            of the bowel preparation was good. The terminal                            ileum, the ileocecal valve, the appendiceal orifice                            and the rectum were photographed. Scope In: 12:47:53 PM Scope Out: 1:00:02 PM Scope Withdrawal Time: 0 hours 9 minutes 41 seconds  Total Procedure Duration: 0 hours 12 minutes 9 seconds  Findings:      The perianal and digital rectal examinations were normal.      A 3 mm polyp was found in the distal ascending colon. The polyp was       sessile. The polyp was removed with a cold biopsy forceps. Resection and       retrieval were complete.      The exam was otherwise without abnormality. Impression:               - One 3 mm polyp in  the distal ascending colon,                            removed with a cold biopsy forceps. Resected and                            retrieved.                           - The examination was otherwise normal. Moderate Sedation:      N/A- Per Anesthesia Care Recommendation:           - Patient has a contact number available for                            emergencies. The signs and symptoms of potential                            delayed complications were discussed with the                            patient. Return to normal activities tomorrow.                            Written discharge instructions were provided to the                             patient.                           - Repeat colonoscopy in 5 years for surveillance.                           - Resume previous diet.                           - Continue present medications. Procedure Code(s):        --- Professional ---                           [phone], Colonoscopy, flexible; with biopsy, single                            or multiple Diagnosis Code(s):        --- Professional ---                           Z86.010, Personal history of colonic polyps                           D12.2, Benign neoplasm of ascending colon CPT copyright 2016 American [employer]. All rights reserved. The codes documented in this report are preliminary and upon coder review may  be revised to meet current compliance requirements. Earle Gell, MD Garlan Fair, MD 09/10/2015 1:06:44 PM This report has been signed electronically. Number of Addenda: 0

## 2015-09-11 ENCOUNTER — Encounter (HOSPITAL_COMMUNITY): Payer: Self-pay | Admitting: Gastroenterology

## 2015-10-17 ENCOUNTER — Encounter: Payer: Self-pay | Admitting: Family Medicine

## 2015-10-17 ENCOUNTER — Ambulatory Visit (INDEPENDENT_AMBULATORY_CARE_PROVIDER_SITE_OTHER): Payer: Commercial Managed Care - HMO | Admitting: Family Medicine

## 2015-10-17 VITALS — BP 100/60 | HR 72 | Temp 97.7°F | Ht 65.0 in | Wt 174.2 lb

## 2015-10-17 DIAGNOSIS — B029 Zoster without complications: Secondary | ICD-10-CM | POA: Diagnosis not present

## 2015-10-17 MED ORDER — VALACYCLOVIR HCL 1 G PO TABS: 1000.0000 mg | ORAL_TABLET | Freq: Three times a day (TID) | ORAL | 0 refills | Status: DC

## 2015-10-17 MED ORDER — BLOOD GLUCOSE TEST VI STRP: ORAL_STRIP | 3 refills | Status: DC

## 2015-10-17 NOTE — Progress Notes (Signed)
Dr. [name] T. Bradley Handyside, MD, [facility] [place], [address] Phone: [phone] Fax: [phone]  10/17/2015  Patient: Kim Cole, MRN: [id number], DOB: May 06, 1945, 70 y.o.  Primary Physician:  Owens Loffler, MD   Chief Complaint  Patient presents with  . Rash    on back   Subjective:   Kim Cole is a 70 y.o. very pleasant female patient who presents with the following:  Started last night.  Hurting.   Shingles R side upper back Symptoms have been present for less than 24 hours, and the patient has had her Zostavax vaccination.  Past Medical History, Surgical History, Social History, Family History, Problem List, Medications, and Allergies have been reviewed and updated if relevant.  Patient Active Problem List   Diagnosis Date Noted  . Diabetes mellitus type 2 in obese ([place]) 11/17/2013    Priority: High  . Hyperlipidemia     Priority: Medium  . Hypertension     Priority: Medium  . Hypothyroid     Priority: Medium  . Obesity (BMI 30-39.9) 11/22/2013  . Arthritis   . Allergic rhinitis due to pollen   . Migraine     Past Medical History:  Diagnosis Date  . Allergic rhinitis due to pollen   . Anemia   . Arthritis   . Diabetes mellitus type 2 in obese ([place]) 11/17/2013  . Hyperlipidemia   . Hypertension   . Hypothyroid   . Migraine    NONE RECENT    Past Surgical History:  Procedure Laterality Date  . COLONOSCOPY WITH PROPOFOL N/A 09/10/2015   Procedure: COLONOSCOPY WITH PROPOFOL;  Surgeon: Garlan Fair, MD;  Location: [facility];  Service: Endoscopy;  Laterality: N/A;  . COLONSCOPY  FEW YRS AGO   2 SMALL POLYPS  . TOTAL HIP ARTHROPLASTY Right 2011   Dr. Wynelle Link  . TUBAL LIGATION      Social History   Social History  . Marital status: Widowed    Spouse name: N/A  . Number of children: N/A  . Years of education: N/A   Occupational History  .  [employer]   Social History Main Topics  . Smoking status: Never Smoker  . Smokeless tobacco: Never Used  . Alcohol use Yes     Comment: SELDOM  . Drug use: No  . Sexual activity: Not on file   Other Topics Concern  . Not on file   Social History Narrative   Worked 40 years for [employer] / [employer]   Married   Children and Kim Cole's mother    No family history on file.  No Known Allergies  Medication list reviewed and updated in full in [facility].  ROS: GEN: Acute illness details above GI: Tolerating PO intake GU: maintaining adequate hydration and urination Pulm: No SOB Interactive and getting along well at home.  Otherwise, ROS is as per the HPI.  Objective:   BP 100/60   Pulse 72   Temp 97.7 F (36.5 C) (Oral)   Ht 5\' 5"  (1.651 m)   Wt 174 lb 4 oz (79 kg)   BMI 29.00 kg/m    GEN: WDWN, NAD, Non-toxic, Alert & Oriented x 3 HEENT: Atraumatic, Normocephalic.  Ears and Nose: No external deformity. EXTR: No clubbing/cyanosis/edema NEURO: Normal gait.  PSYCH: Normally interactive. Conversant. Not depressed or anxious appearing.  Calm demeanor.    Skin: Vesicular pattern  right upper back in a dermatomal distribution   Laboratory and Imaging Data:  Assessment and Plan:   Shingles  Classic shingles.  Treat as such.  Follow-up: No Follow-up on file.  New Prescriptions   GLUCOSE BLOOD (BLOOD GLUCOSE TEST STRIPS) STRP    Use to check blood sugar once daily.  Accu-Chek Meter  Dx: E11.9   VALACYCLOVIR (VALTREX) 1000 MG TABLET    Take 1 tablet (1,000 mg total) by mouth 3 (three) times daily.   Signed,  Maud Deed. Vann Okerlund, MD   Patient's Medications  New Prescriptions   GLUCOSE BLOOD (BLOOD GLUCOSE TEST STRIPS) STRP    Use to check blood sugar once daily.  Accu-Chek Meter  Dx: E11.9   VALACYCLOVIR (VALTREX) 1000 MG TABLET    Take 1 tablet (1,000 mg total) by mouth 3 (three) times daily.    Previous Medications   CHOLECALCIFEROL (VITAMIN D) 1000 UNITS TABLET    Take 1,000 Units by mouth daily.   LEVOTHYROXINE (SYNTHROID, LEVOTHROID) 88 MCG TABLET    Take 1 tablet (88 mcg total) by mouth daily before breakfast.   LISINOPRIL-HYDROCHLOROTHIAZIDE (PRINZIDE,ZESTORETIC) 10-12.5 MG TABLET    Take 1 tablet by mouth daily.   METFORMIN (GLUCOPHAGE) 500 MG TABLET    Take 500 mg by mouth daily with breakfast.   MISC NATURAL PRODUCTS (GLUCOSAMINE CHONDROITIN VIT D3) CAPS    Take by mouth.   MULTIPLE VITAMINS-MINERALS (EYE VITAMINS) TABS    Take 1 tablet by mouth daily.   SIMVASTATIN (ZOCOR) 20 MG TABLET    Take 1 tablet (20 mg total) by mouth every evening.   TIZANIDINE (ZANAFLEX) 4 MG TABLET    Take 1 tablet (4 mg total) by mouth Nightly.   VITAMIN B-12 (CYANOCOBALAMIN) 1000 MCG TABLET    Take 1,000 mcg by mouth daily.   VITAMIN E 400 UNIT CAPSULE    Take 400 Units by mouth daily.  Modified Medications   No medications on file  Discontinued Medications   METFORMIN (GLUCOPHAGE) 500 MG TABLET    TAKE 2 TABLETS AT BEDTIME

## 2015-10-17 NOTE — Progress Notes (Signed)
Pre visit review using our clinic review tool, if applicable. No additional management support is needed unless otherwise documented below in the visit note. 

## 2015-10-18 ENCOUNTER — Telehealth: Payer: Self-pay

## 2015-10-18 ENCOUNTER — Other Ambulatory Visit: Payer: Self-pay | Admitting: *Deleted

## 2015-10-18 MED ORDER — ACCU-CHEK FASTCLIX LANCETS MISC: 3 refills | Status: DC

## 2015-10-18 MED ORDER — GLUCOSE BLOOD VI STRP: ORAL_STRIP | 3 refills | Status: DC

## 2015-10-18 NOTE — Telephone Encounter (Signed)
Pt left v/m; pt was seen 10/17/15 with shingles/ pt wants to know if can have pain med for shingles. Pt wants to know if can try tylenol or what does Dr Lorelei Pont prefer pt to take for pain. [facility] [address]. Pt request cb.

## 2015-10-19 NOTE — Telephone Encounter (Signed)
Kim Cole notified as instructed by telephone.  She already has some Tramadol at home so she does not need a prescription for that at this time.

## 2015-10-19 NOTE — Telephone Encounter (Signed)
I would start with extra strength tylenol, 2 tablets up to 3 times a day.  A mild pain medication like tramadol can help if needed. If needed, take along with the tylenol  Tramadol 50 mg, 1 po q 6 hours prn pain, #50, 1 refills

## 2016-01-19 ENCOUNTER — Other Ambulatory Visit: Payer: Self-pay | Admitting: Family Medicine

## 2016-01-22 NOTE — Telephone Encounter (Signed)
Last labs abnormal. pls advise 

## 2016-01-25 NOTE — Telephone Encounter (Signed)
Pt request status of simvastatin refill to [facility]; advised pt sent electronically on 01/23/16. Pt voiced understanding.

## 2016-03-11 ENCOUNTER — Other Ambulatory Visit: Payer: Self-pay | Admitting: Family Medicine

## 2016-06-09 ENCOUNTER — Telehealth: Payer: Self-pay

## 2016-06-09 NOTE — Telephone Encounter (Signed)
Pt [facility] in to [facility] and wants to change to their pharmacy. Pt will ck the name of the pharmacy she will want this to go to and will schedule yearly exam.pt will cb.

## 2016-06-10 MED ORDER — SIMVASTATIN 20 MG PO TABS: 20.0000 mg | ORAL_TABLET | Freq: Every evening | ORAL | 0 refills | Status: DC

## 2016-06-10 NOTE — Telephone Encounter (Signed)
Pt is presently using [facility]. Last annual 03/29/15; will refill # 90 and pt will cb to schedule CPX prior to med running out. Pt is at work now.pt voiced understanding.

## 2016-06-10 NOTE — Addendum Note (Signed)
Addended by: Helene Shoe on: 06/10/2016 03:13 PM   Modules accepted: Orders

## 2016-07-03 ENCOUNTER — Other Ambulatory Visit: Payer: Self-pay | Admitting: *Deleted

## 2016-07-03 MED ORDER — LISINOPRIL-HYDROCHLOROTHIAZIDE 10-12.5 MG PO TABS: 1.0000 | ORAL_TABLET | Freq: Every day | ORAL | 0 refills | Status: DC

## 2016-07-03 NOTE — Telephone Encounter (Signed)
Pt scheduled CPE for 08/2016

## 2016-07-04 NOTE — Telephone Encounter (Signed)
Pt wanted to verify lisinopril HCTZ was sent to pharmacy. Advised pt done. Pt voiced understanding.

## 2016-07-17 ENCOUNTER — Ambulatory Visit (INDEPENDENT_AMBULATORY_CARE_PROVIDER_SITE_OTHER): Payer: PPO | Admitting: Family Medicine

## 2016-07-17 ENCOUNTER — Encounter: Payer: Self-pay | Admitting: Family Medicine

## 2016-07-17 VITALS — BP 138/70 | HR 65 | Temp 98.6°F | Ht 64.5 in | Wt 173.5 lb

## 2016-07-17 DIAGNOSIS — Z0000 Encounter for general adult medical examination without abnormal findings: Secondary | ICD-10-CM | POA: Diagnosis not present

## 2016-07-17 DIAGNOSIS — E890 Postprocedural hypothyroidism: Secondary | ICD-10-CM

## 2016-07-17 DIAGNOSIS — E119 Type 2 diabetes mellitus without complications: Secondary | ICD-10-CM | POA: Diagnosis not present

## 2016-07-17 DIAGNOSIS — Z1159 Encounter for screening for other viral diseases: Secondary | ICD-10-CM | POA: Diagnosis not present

## 2016-07-17 MED ORDER — LISINOPRIL-HYDROCHLOROTHIAZIDE 10-12.5 MG PO TABS: 1.0000 | ORAL_TABLET | Freq: Every day | ORAL | 3 refills | Status: DC

## 2016-07-17 NOTE — Progress Notes (Signed)
Pre visit review using our clinic review tool, if applicable. No additional management support is needed unless otherwise documented below in the visit note. 

## 2016-07-17 NOTE — Progress Notes (Signed)
Dr. [name] T. Garritt Molyneux, MD, [facility] [place], [address] Phone: [phone] Fax: [phone]  07/17/2016  Patient: Kim Cole, MRN: [id number], DOB: 08-Jun-1945, 71 y.o.  Primary Physician:  Owens Loffler, MD   Chief Complaint  Patient presents with  . Medicare Wellness   Subjective:   ETHELDA Cole is a 71 y.o. pleasant patient who presents for a medicare wellness examination:  Health Maintenance Summary Reviewed and updated, unless pt declines services.  Tobacco History Reviewed. Non-smoker Alcohol: No concerns, no excessive use Exercise Habits: Some activity, rec at least 30 mins 5 times a week STD concerns: none Drug Use: None Birth control method: n/a Menses regular: n/a Lumps or breast concerns: no Breast Cancer Family History: no  Doing well overall.  Upcoming retirement.  Health Maintenance  Topic Date Due  . Hepatitis C Screening  09-29-1945  . TETANUS/TDAP  01/12/1966  . HEMOGLOBIN A1C  09/24/2015  . OPHTHALMOLOGY EXAM  03/31/2016  . INFLUENZA VACCINE  10/22/2016  . MAMMOGRAM  04/19/2017  . FOOT EXAM  07/18/2017  . COLONOSCOPY  09/09/2020  . DEXA SCAN  Completed  . PNA vac Low Risk Adult  Completed   Immunization History  Administered Date(s) Administered  . Influenza, High Dose Seasonal PF 04/26/2015, 01/12/2016  . Pneumococcal Conjugate-13 11/16/2013  . Pneumococcal Polysaccharide-23 10/11/2012    Patient Active Problem List   Diagnosis Date Noted  . Diabetes mellitus type 2 in obese ([place]) 11/17/2013    Priority: High  . Hyperlipidemia     Priority: Medium  . Hypertension     Priority: Medium  . Hypothyroid     Priority: Medium  . Obesity (BMI 30-39.9) 11/22/2013  . Arthritis   . Allergic rhinitis due to pollen   . Migraine    Past Medical History:  Diagnosis Date  . Allergic rhinitis due to pollen   . Anemia   . Arthritis   . Diabetes mellitus type 2  in obese ([place]) 11/17/2013  . Hyperlipidemia   . Hypertension   . Hypothyroid   . Migraine    NONE RECENT   Past Surgical History:  Procedure Laterality Date  . COLONOSCOPY WITH PROPOFOL N/A 09/10/2015   Procedure: COLONOSCOPY WITH PROPOFOL;  Surgeon: Garlan Fair, MD;  Location: [facility];  Service: Endoscopy;  Laterality: N/A;  . COLONSCOPY  FEW YRS AGO   2 SMALL POLYPS  . TOTAL HIP ARTHROPLASTY Right 2011   Dr. Wynelle Link  . TUBAL LIGATION     Social History   Social History  . Marital status: Widowed    Spouse name: N/A  . Number of children: N/A  . Years of education: N/A   Occupational History  .  [employer]   Social History Main Topics  . Smoking status: Never Smoker  . Smokeless tobacco: Never Used  . Alcohol use Yes     Comment: SELDOM  . Drug use: No  . Sexual activity: Not on file   Other Topics Concern  . Not on file   Social History Narrative   Worked 38 years for [employer] / [employer]   Married   Children and Glynda Jaeger Barrasso's mother   No family history on file. No Known Allergies  Medication list has been reviewed and updated.   General: Denies fever, chills, sweats. No significant weight loss. Eyes: Denies blurring,significant itching ENT: Denies earache, sore throat, and hoarseness.  Cardiovascular:  Denies chest pains, palpitations, dyspnea on exertion,  Respiratory: Denies cough, dyspnea at rest,wheeezing Breast: no concerns about lumps GI: Denies nausea, vomiting, diarrhea, constipation, change in bowel habits, abdominal pain, melena, hematochezia GU: Denies dysuria, hematuria, urinary hesitancy, nocturia, denies STD risk, no concerns about discharge Musculoskeletal: Denies back pain, joint pain Derm: Denies rash, itching Neuro: Denies  paresthesias, frequent falls, frequent headaches Psych: Denies depression, anxiety Endocrine: Denies cold intolerance, heat intolerance,  polydipsia Heme: Denies enlarged lymph nodes Allergy: No hayfever  Objective:   BP 138/70   Pulse 65   Temp 98.6 F (37 C) (Oral)   Ht 5' 4.5" (1.638 m)   Wt 173 lb 8 oz (78.7 kg)   BMI 29.32 kg/m   The patient completed a fall screen and PHQ-2 and PHQ-9 if necessary, which is documented in the EHR. The CMA/LPN/RN who assisted the patient verbally completed with them and documented results in [facility] EHR.   Hearing Screening   Method: Audiometry   _0  _1  _2  _3  _4  _5  _6  _7  _8   Right ear:   _9 40    Left ear:   _10 40    Vision Screening Comments: Wears glasses-Eye Exam with Dr. Matilde Sprang 2017  GEN: well developed, well nourished, no acute distress Eyes: conjunctiva and lids normal, PERRLA, EOMI ENT: TM clear, nares clear, oral exam WNL Neck: supple, no lymphadenopathy, no thyromegaly, no JVD Pulm: clear to auscultation and percussion, respiratory effort normal CV: regular rate and rhythm, S1-S2, no murmur, rub or gallop, no bruits Chest: no scars, masses, no lumps BREAST: breast exam normal. Chaperoned examination. Entirety of breast examined including axilla, and no enlarged lymph nodes. No significant masses are felt. No nipple discharge. No skin changes. Overall, reassuring breast exam.  GI: soft, non-tender; no hepatosplenomegaly, masses; active bowel sounds all quadrants GU: GU exam declined Lymph: no cervical, axillary or inguinal adenopathy MSK: gait normal, muscle tone and strength WNL, no joint swelling, effusions, discoloration, crepitus  SKIN: clear, good turgor, color WNL, no rashes, lesions, or ulcerations Neuro: normal mental status, normal strength, sensation, and motion Psych: alert; oriented to person, place and time, normally interactive and not anxious or depressed in appearance.   All labs reviewed with patient. Lipids:    Component Value Date/Time   CHOL 178 07/17/2016 1728   TRIG 119.0 07/17/2016 1728     HDL 55.70 07/17/2016 1728   VLDL 23.8 07/17/2016 1728   CHOLHDL 3 07/17/2016 1728   CBC: CBC Latest Ref Rng & Units 03/27/2015 06/21/2012 10/06/2009  WBC 4.0 - 10.5 K/uL 4.4 7.8 7.0  Hemoglobin 12.0 - 15.0 g/dL 13.6 13.8 11.0 DELTA CHECK NOTED POST TRANSFUSION SPECIMEN(L)  Hematocrit 36.0 - 46.0 % 41.2 41.0 31.9(L)  Platelets 150.0 - 400.0 K/uL 198.0 207.0 148(L)    Basic Metabolic Panel:    Component Value Date/Time   NA 139 03/27/2015 1009   K 4.0 03/27/2015 1009   CL 104 03/27/2015 1009   CO2 29 03/27/2015 1009   BUN 20 03/27/2015 1009   CREATININE 0.82 03/27/2015 1009   GLUCOSE 159 (H) 03/27/2015 1009   CALCIUM 9.7 03/27/2015 1009   Hepatic Function Latest Ref Rng & Units 07/17/2016 03/27/2015 09/21/2009  Total Protein 6.0 - 8.3 g/dL 7.6 6.6 7.3  Albumin 3.5 - 5.2 g/dL 4.6 4.1 4.3  AST 0 - 37 U/L _11 ALT 0 - 35 U/L 17 22 35  Alk Phosphatase 39 - 117 U/L  97 85 78  Total Bilirubin 0.2 - 1.2 mg/dL 0.7 0.5 1.0  Bilirubin, Direct 0.0 - 0.3 mg/dL 0.1 0.1 -    Lab Results  Component Value Date   TSH 1.98 07/17/2016   Lab Results  Component Value Date   HGBA1C 6.2 07/17/2016     Assessment and Plan:   Healthcare maintenance - Plan: Basic metabolic panel, CBC with Differential/Platelet, Hepatic function panel, Lipid panel  Need for hepatitis C screening test - Plan: Hepatitis C antibody  Postablative hypothyroidism - Plan: TSH, T4, free, T3, free  Controlled type 2 diabetes mellitus without complication, without long-term current use of insulin (HCC) - Plan: Hemoglobin A1c, Microalbumin / creatinine urine ratio  All chronic conditions are stable.  Health Maintenance Exam: The patient's preventative maintenance and recommended screening tests for an annual wellness exam were reviewed in full today. Brought up to date unless services declined.  Counselled on the importance of diet, exercise, and its role in overall health and mortality. The patient's FH and SH was  reviewed, including their home life, tobacco status, and drug and alcohol status.  Follow-up in 1 year for physical exam or additional follow-up below.  I have personally reviewed the Medicare Annual Wellness questionnaire and have noted 1. The patient's medical and social history 2. Their use of alcohol, tobacco or illicit drugs 3. Their current medications and supplements 4. The patient's functional ability including ADL's, fall risks, home safety risks and hearing or visual             impairment. 5. Diet and physical activities 6. Evidence for depression or mood disorders 7. Reviewed Updated provider list, see scanned forms and [facility] Snapshot.   The patients weight, height, BMI and visual acuity have been recorded in the chart I have made referrals, counseling and provided education to the patient based review of the above and I have provided the pt with a written personalized care plan for preventive services.  I have provided the patient with a copy of your personalized plan for preventive services. Instructed to take the time to review along with their updated medication list.  Follow-up: No Follow-up on file. Or follow-up in 1 year unless noted.  Meds ordered this encounter  Medications  . lisinopril-hydrochlorothiazide (PRINZIDE,ZESTORETIC) 10-12.5 MG tablet    Sig: Take 1 tablet by mouth daily.    Dispense:  90 tablet    Refill:  3   Medications Discontinued During This Encounter  Medication Reason  . valACYclovir (VALTREX) 1000 MG tablet Completed Course  . tiZANidine (ZANAFLEX) 4 MG tablet Completed Course  . lisinopril-hydrochlorothiazide (PRINZIDE,ZESTORETIC) 10-12.5 MG tablet Reorder   Orders Placed This Encounter  Procedures  . Basic metabolic panel  . CBC with Differential/Platelet  . Hepatic function panel  . Lipid panel  . Hemoglobin A1c  . Microalbumin / creatinine urine ratio  . Hepatitis C antibody  . TSH  . T4, free  . T3, free     Signed,  Flora Ratz T. Jibreel Fedewa, MD   Allergies as of 07/17/2016   No Known Allergies     Medication List       Accurate as of 07/17/16 11:59 PM. Always use your most recent med list.          ACCU-CHEK FASTCLIX LANCETS Misc Use to check blood sugar once daily.  Dx: E11.9   cholecalciferol 1000 units tablet Commonly known as:  VITAMIN D Take 1,000 Units by mouth daily.   EYE VITAMINS Tabs Take 1 tablet  by mouth daily.   GLUCOSAMINE CHONDROITIN VIT D3 Caps Take by mouth.   glucose blood test strip Commonly known as:  ACCU-CHEK SMARTVIEW Use to check blood sugar once daily.  Dx: E11.9   levothyroxine 88 MCG tablet Commonly known as:  SYNTHROID, LEVOTHROID TAKE 1 TABLET DAILY BEFORE BREAKFAST   lisinopril-hydrochlorothiazide 10-12.5 MG tablet Commonly known as:  PRINZIDE,ZESTORETIC Take 1 tablet by mouth daily.   metFORMIN 500 MG tablet Commonly known as:  GLUCOPHAGE Take 500 mg by mouth daily with breakfast.   simvastatin 20 MG tablet Commonly known as:  ZOCOR Take 1 tablet (20 mg total) by mouth every evening.   vitamin B-12 1000 MCG tablet Commonly known as:  CYANOCOBALAMIN Take 1,000 mcg by mouth daily.   vitamin E 400 UNIT capsule Take 400 Units by mouth daily.

## 2016-07-18 LAB — LIPID PANEL
Cholesterol: 178 mg/dL (ref 0–200)
HDL: 55.7 mg/dL (ref >39.00)
LDL Cholesterol: 98 mg/dL (ref 0–99)
NonHDL: 122.02
Total CHOL/HDL Ratio: 3
Triglycerides: 119 mg/dL (ref 0.0–149.0)
VLDL: 23.8 mg/dL (ref 0.0–40.0)

## 2016-07-18 LAB — MICROALBUMIN / CREATININE URINE RATIO
Creatinine,U: 129.4 mg/dL
Microalb Creat Ratio: 0.7 mg/g (ref 0.0–30.0)
Microalb, Ur: 0.9 mg/dL (ref 0.0–1.9)

## 2016-07-18 LAB — HEPATIC FUNCTION PANEL
ALT: 17 U/L (ref 0–35)
AST: 19 U/L (ref 0–37)
Albumin: 4.6 g/dL (ref 3.5–5.2)
Alkaline Phosphatase: 97 U/L (ref 39–117)
Bilirubin, Direct: 0.1 mg/dL (ref 0.0–0.3)
Total Bilirubin: 0.7 mg/dL (ref 0.2–1.2)
Total Protein: 7.6 g/dL (ref 6.0–8.3)

## 2016-07-18 LAB — TSH: TSH: 1.98 u[IU]/mL (ref 0.35–4.50)

## 2016-07-18 LAB — HEPATITIS C ANTIBODY: HCV Ab: NEGATIVE

## 2016-07-18 LAB — HEMOGLOBIN A1C: Hgb A1c MFr Bld: 6.2 % (ref 4.6–6.5)

## 2016-07-18 LAB — T4, FREE: Free T4: 0.98 ng/dL (ref 0.60–1.60)

## 2016-07-18 LAB — T3, FREE: T3, Free: 4 pg/mL (ref 2.3–4.2)

## 2016-07-18 MED ORDER — LEVOTHYROXINE SODIUM 88 MCG PO TABS: 88.0000 ug | ORAL_TABLET | Freq: Every day | ORAL | 3 refills | Status: DC

## 2016-08-01 DIAGNOSIS — M25551 Pain in right hip: Secondary | ICD-10-CM | POA: Diagnosis not present

## 2016-08-01 DIAGNOSIS — M1612 Unilateral primary osteoarthritis, left hip: Secondary | ICD-10-CM | POA: Diagnosis not present

## 2016-08-19 ENCOUNTER — Encounter: Payer: Self-pay | Admitting: *Deleted

## 2016-08-27 ENCOUNTER — Encounter: Payer: Commercial Managed Care - HMO | Admitting: Family Medicine

## 2016-10-01 ENCOUNTER — Other Ambulatory Visit: Payer: Self-pay | Admitting: Family Medicine

## 2016-10-21 ENCOUNTER — Other Ambulatory Visit: Payer: Self-pay | Admitting: Family Medicine

## 2016-10-31 ENCOUNTER — Other Ambulatory Visit: Payer: Self-pay | Admitting: Family Medicine

## 2016-11-10 ENCOUNTER — Telehealth: Payer: Self-pay | Admitting: Family Medicine

## 2016-11-10 MED ORDER — METFORMIN HCL 500 MG PO TABS: 500.0000 mg | ORAL_TABLET | Freq: Every day | ORAL | 1 refills | Status: DC

## 2016-11-10 NOTE — Telephone Encounter (Signed)
Patient left message on line asking for refill on metformin, her current pharmacy is [facility]  Callback# [phone]

## 2016-11-10 NOTE — Telephone Encounter (Signed)
Thanks - can you please explain how refills are done to Kim Cole.

## 2017-06-11 ENCOUNTER — Other Ambulatory Visit: Payer: Self-pay | Admitting: Family Medicine

## 2017-07-22 ENCOUNTER — Ambulatory Visit (INDEPENDENT_AMBULATORY_CARE_PROVIDER_SITE_OTHER): Payer: PPO

## 2017-07-22 VITALS — BP 100/64 | HR 65 | Temp 97.9°F | Ht 64.5 in | Wt 169.2 lb

## 2017-07-22 DIAGNOSIS — E669 Obesity, unspecified: Secondary | ICD-10-CM

## 2017-07-22 DIAGNOSIS — I10 Essential (primary) hypertension: Secondary | ICD-10-CM | POA: Diagnosis not present

## 2017-07-22 DIAGNOSIS — E1169 Type 2 diabetes mellitus with other specified complication: Secondary | ICD-10-CM

## 2017-07-22 DIAGNOSIS — Z0000 Encounter for general adult medical examination without abnormal findings: Secondary | ICD-10-CM

## 2017-07-22 DIAGNOSIS — E785 Hyperlipidemia, unspecified: Secondary | ICD-10-CM

## 2017-07-22 LAB — CBC WITH DIFFERENTIAL/PLATELET
Basophils Absolute: 0 10*3/uL (ref 0.0–0.1)
Basophils Relative: 0.4 % (ref 0.0–3.0)
Eosinophils Absolute: 0 10*3/uL (ref 0.0–0.7)
Eosinophils Relative: 0.7 % (ref 0.0–5.0)
HCT: 38.2 % (ref 36.0–46.0)
Hemoglobin: 13.1 g/dL (ref 12.0–15.0)
Lymphocytes Relative: 32 % (ref 12.0–46.0)
Lymphs Abs: 1.4 10*3/uL (ref 0.7–4.0)
MCHC: 34.3 g/dL (ref 30.0–36.0)
MCV: 86.9 fl (ref 78.0–100.0)
Monocytes Absolute: 0.3 10*3/uL (ref 0.1–1.0)
Monocytes Relative: 7.6 % (ref 3.0–12.0)
Neutro Abs: 2.7 10*3/uL (ref 1.4–7.7)
Neutrophils Relative %: 59.3 % (ref 43.0–77.0)
Platelets: 186 10*3/uL (ref 150.0–400.0)
RBC: 4.4 Mil/uL (ref 3.87–5.11)
RDW: 14.8 % (ref 11.5–15.5)
WBC: 4.5 10*3/uL (ref 4.0–10.5)

## 2017-07-22 LAB — BASIC METABOLIC PANEL
BUN: 12 mg/dL (ref 6–23)
CO2: 30 mEq/L (ref 19–32)
Calcium: 9.8 mg/dL (ref 8.4–10.5)
Chloride: 102 mEq/L (ref 96–112)
Creatinine, Ser: 0.96 mg/dL (ref 0.40–1.20)
GFR: 60.98 mL/min (ref >60.00)
Glucose, Bld: 135 mg/dL — ABNORMAL HIGH (ref 70–99)
Potassium: 4 mEq/L (ref 3.5–5.1)
Sodium: 140 mEq/L (ref 135–145)

## 2017-07-22 LAB — TSH: TSH: 0.77 u[IU]/mL (ref 0.35–4.50)

## 2017-07-22 LAB — HEPATIC FUNCTION PANEL
ALT: 21 U/L (ref 0–35)
AST: 21 U/L (ref 0–37)
Albumin: 4.2 g/dL (ref 3.5–5.2)
Alkaline Phosphatase: 66 U/L (ref 39–117)
Bilirubin, Direct: 0.2 mg/dL (ref 0.0–0.3)
Total Bilirubin: 0.8 mg/dL (ref 0.2–1.2)
Total Protein: 6.9 g/dL (ref 6.0–8.3)

## 2017-07-22 LAB — T3, FREE: T3, Free: 3.6 pg/mL (ref 2.3–4.2)

## 2017-07-22 LAB — LIPID PANEL
Cholesterol: 130 mg/dL (ref 0–200)
HDL: 47.8 mg/dL (ref >39.00)
LDL Cholesterol: 61 mg/dL (ref 0–99)
NonHDL: 82.32
Total CHOL/HDL Ratio: 3
Triglycerides: 108 mg/dL (ref 0.0–149.0)
VLDL: 21.6 mg/dL (ref 0.0–40.0)

## 2017-07-22 LAB — HEMOGLOBIN A1C: Hgb A1c MFr Bld: 6.2 % (ref 4.6–6.5)

## 2017-07-22 LAB — T4, FREE: Free T4: 1.07 ng/dL (ref 0.60–1.60)

## 2017-07-22 LAB — MICROALBUMIN / CREATININE URINE RATIO
Creatinine,U: 97.7 mg/dL
Microalb Creat Ratio: 1.2 mg/g (ref 0.0–30.0)
Microalb, Ur: 1.2 mg/dL (ref 0.0–1.9)

## 2017-07-22 NOTE — Progress Notes (Signed)
PCP notes:   Health maintenance:  Foot exam - PCP please address at next appt Mammogram - addressed Bone density - addressed Eye exam - addressed Tetanus vaccine - postponed/insurance  Abnormal screenings:   Fall risk - hx of single fall Fall Risk  07/22/2017 07/17/2016 03/29/2015 11/16/2013 10/11/2012  Falls in the past year? Yes Yes No No No  Comment fell while doing yardwork; no injury - - - -  Number falls in past yr: 1 1 - - -  Injury with Fall? No No - - -   Mini-Cog score:  17/20 MMSE - Mini Mental State Exam 07/22/2017  Orientation to time 5  Orientation to Place 5  Registration 3  Attention/ Calculation 0  Recall 1  Recall-comments unable to recall 2 of 3 words  Language- name 2 objects 0  Language- repeat 1  Language- follow 3 step command 3  Language- read & follow direction 0  Write a sentence 0  Copy design 0  Total score 18    Patient concerns:   Urinary incontinence - onset within last 12 mths  Intermittent midback pain - pain scale: 1/10, aching, onset 2-3 wks ago  Nurse concerns:  None  Next PCP appt:   07/30/17 @ 1040

## 2017-07-22 NOTE — Progress Notes (Signed)
I reviewed health advisor's note, was available for consultation, and agree with documentation and plan.   Signed,  Yehudah Standing T. Janyiah Silveri, MD  

## 2017-07-22 NOTE — Progress Notes (Signed)
Subjective:   Kim Cole is a 72 y.o. female who presents for Medicare Annual (Subsequent) preventive examination.  Review of Systems:  N/A Cardiac Risk Factors include: advanced age (>77men, >24 women);diabetes mellitus;hypertension;dyslipidemia     Objective:     Vitals: BP 100/64 (BP Location: Right Arm, Patient Position: Sitting, Cuff Size: Normal)   Pulse 65   Temp 97.9 F (36.6 C) (Oral)   Ht 5' 4.5" (1.638 m) Comment: no shoes  Wt 169 lb 4 oz (76.8 kg)   SpO2 99%   BMI 28.60 kg/m   Body mass index is 28.6 kg/m.  Advanced Directives 07/22/2017 09/10/2015 09/07/2015  Does Patient Have a Medical Advance Directive? Yes Yes Yes  Type of Paramedic of [place];Living will [place];Living will Living will;Healthcare Power of Attorney  Does patient want to make changes to medical advance directive? - - No - Patient declined  Copy of [place] in Chart? No - copy requested No - copy requested No - copy requested    Tobacco Social History   Tobacco Use  Smoking Status Never Smoker  Smokeless Tobacco Never Used     Counseling given: No   Clinical Intake:  Pre-visit preparation completed: Yes  Pain : No/denies pain Pain Score: 0-No pain     Nutritional Status: BMI 25 -29 Overweight Nutritional Risks: None Diabetes: Yes CBG done?: No Did pt. bring in CBG monitor from home?: No  How often do you need to have someone help you when you read instructions, pamphlets, or other written materials from your doctor or pharmacy?: 1 - Never What is the last grade level you completed in school?: 12th grade  Interpreter Needed?: No  Comments: pt is a widow and lives alone Information entered by :: LPinson, LPN  Past Medical History:  Diagnosis Date  . Allergic rhinitis due to pollen   . Anemia   . Arthritis   . Diabetes mellitus type 2 in obese ([place]) 11/17/2013  . Hyperlipidemia   . Hypertension     . Hypothyroid   . Migraine    NONE RECENT   Past Surgical History:  Procedure Laterality Date  . COLONOSCOPY WITH PROPOFOL N/A 09/10/2015   Procedure: COLONOSCOPY WITH PROPOFOL;  Surgeon: Garlan Fair, MD;  Location: [facility];  Service: Endoscopy;  Laterality: N/A;  . COLONSCOPY  FEW YRS AGO   2 SMALL POLYPS  . TOTAL HIP ARTHROPLASTY Right 2011   Dr. Wynelle Link  . TUBAL LIGATION     History reviewed. No pertinent family history. Social History   Socioeconomic History  . Marital status: Widowed    Spouse name: Not on file  . Number of children: Not on file  . Years of education: Not on file  . Highest education level: Not on file  Occupational History    Employer: [employer]  Social Needs  . Financial resource strain: Not on file  . Food insecurity:    Worry: Not on file    Inability: Not on file  . Transportation needs:    Medical: Not on file    Non-medical: Not on file  Tobacco Use  . Smoking status: Never Smoker  . Smokeless tobacco: Never Used  Substance and Sexual Activity  . Alcohol use: Yes    Comment: S[place]OM  . Drug use: No  . Sexual activity: Not Currently  Lifestyle  . Physical activity:    Days per week: Not on file    Minutes  per session: Not on file  . Stress: Not on file  Relationships  . Social connections:    Talks on phone: Not on file    Gets together: Not on file    Attends religious service: Not on file    Active member of club or organization: Not on file    Attends meetings of clubs or organizations: Not on file    Relationship status: Not on file  Other Topics Concern  . Not on file  Social History Narrative   Worked 40 years for [employer] / [employer]   Married   Children and Arnie Maiolo mother    Outpatient Encounter Medications as of 07/22/2017  Medication Sig  . ACCU-CHEK FASTCLIX LANCETS MISC Use to check blood sugar once daily.  Dx: E11.9  . cholecalciferol  (VITAMIN D) 1000 UNITS tablet Take 1,000 Units by mouth daily.  Marland Kitchen glucose blood (ACCU-CHEK SMARTVIEW) test strip Use to check blood sugar once daily.  Dx: E11.9  . levothyroxine (SYNTHROID, LEVOTHROID) 88 MCG tablet Take 1 tablet (88 mcg total) by mouth daily before breakfast.  . lisinopril-hydrochlorothiazide (PRINZIDE,ZESTORETIC) 10-12.5 MG tablet Take 1 tablet by mouth daily.  . metFORMIN (GLUCOPHAGE) 500 MG tablet Take 1 tablet by mouth daily with breakfast.  . Misc Natural Products (GLUCOSAMINE CHONDROITIN VIT D3) CAPS Take by mouth.  . Multiple Vitamins-Minerals (EYE VITAMINS) TABS Take 1 tablet by mouth daily.  . simvastatin (ZOCOR) 20 MG tablet Take 1 tablet by mouth every evening  . vitamin B-12 (CYANOCOBALAMIN) 1000 MCG tablet Take 1,000 mcg by mouth daily.  . vitamin E 400 UNIT capsule Take 400 Units by mouth daily.   No facility-administered encounter medications on file as of 07/22/2017.     Activities of Daily Living In your present state of health, do you have any difficulty performing the following activities: 07/22/2017  Hearing? N  Vision? N  Difficulty concentrating or making decisions? N  Walking or climbing stairs? N  Dressing or bathing? N  Doing errands, shopping? N  Preparing Food and eating ? N  Using the Toilet? N  In the past six months, have you accidently leaked urine? Y  Do you have problems with loss of bowel control? N  Managing your Medications? N  Managing your Finances? N  Housekeeping or managing your Housekeeping? N  Some recent data might be hidden    Patient Care Team: Owens Loffler, MD as PCP - General (Family Medicine)    Assessment:   This is a routine wellness examination for Kim Cole.   Hearing Screening   125Hz  250Hz  500Hz  1000Hz  2000Hz  3000Hz  4000Hz  6000Hz  8000Hz   Right ear:   40 40 40  40    Left ear:   40 40 40  40      Visual Acuity Screening   Right eye Left eye Both eyes  Without correction:     With correction: 20/20-1  20/20-1 20/20-1    Exercise Activities and Dietary recommendations Current Exercise Habits: The patient does not participate in regular exercise at present, Exercise limited by: None identified  Goals    . Patient Stated     Starting 07/22/17, I will continue to take medications as prescribed.        Fall Risk Fall Risk  07/22/2017 07/17/2016 03/29/2015 11/16/2013 10/11/2012  Falls in the past year? Yes Yes No No No  Comment fell while doing yardwork; no injury - - - -  Number falls in past yr:  1 1 - - -  Injury with Fall? No No - - -   Depression Screen PHQ 2/9 Scores 07/22/2017 07/17/2016 03/29/2015 11/16/2013  PHQ - 2 Score 0 0 0 0  PHQ- 9 Score 0 - - -     Cognitive Function MMSE - Mini Mental State Exam 07/22/2017  Orientation to time 5  Orientation to Place 5  Registration 3  Attention/ Calculation 0  Recall 1  Recall-comments unable to recall 2 of 3 words  Language- name 2 objects 0  Language- repeat 1  Language- follow 3 step command 3  Language- read & follow direction 0  Write a sentence 0  Copy design 0  Total score 18       PLEASE NOTE: A Mini-Cog screen was completed. Maximum score is 20. A value of 0 denotes this part of [place] MMSE was not completed or the patient failed this part of the Mini-Cog screening.   Mini-Cog Screening Orientation to Time - Max 5 pts Orientation to Place - Max 5 pts Registration - Max 3 pts Recall - Max 3 pts Language Repeat - Max 1 pts Language Follow 3 Step Command - Max 3 pts   Immunization History  Administered Date(s) Administered  . Influenza, High Dose Seasonal PF 04/26/2015, 01/12/2016, 02/07/2017  . Pneumococcal Conjugate-13 11/16/2013  . Pneumococcal Polysaccharide-23 10/11/2012    Screening Tests Health Maintenance  Topic Date Due  . FOOT EXAM  07/30/2017 (Originally 07/18/2017)  . MAMMOGRAM  07/23/2018 (Originally 04/19/2017)  . OPHTHALMOLOGY EXAM  07/23/2018 (Originally 03/31/2016)  . TETANUS/TDAP  07/23/2018  (Originally 01/12/1965)  . INFLUENZA VACCINE  10/22/2017  . HEMOGLOBIN A1C  01/22/2018  . COLONOSCOPY  09/09/2020  . DEXA SCAN  Completed  . Hepatitis C Screening  Completed  . PNA vac Low Risk Adult  Completed       Plan:     I have personally reviewed, addressed, and noted the following in the patient's chart:  A. Medical and social history B. Use of alcohol, tobacco or illicit drugs  C. Current medications and supplements D. Functional ability and status E.  Nutritional status F.  Physical activity G. Advance directives H. List of other physicians I.  Hospitalizations, surgeries, and ER visits in previous 12 months J.  [place] to include hearing, vision, cognitive, depression L. Referrals and appointments - none  In addition, I have reviewed and discussed with patient certain preventive protocols, quality metrics, and best practice recommendations. A written personalized care plan for preventive services as well as general preventive health recommendations were provided to patient.  See attached scanned questionnaire for additional information.   Signed,   Lindell Noe, MHA, BS, LPN Health Coach

## 2017-07-22 NOTE — Patient Instructions (Addendum)
---Please contact insurance regarding coverage for Tetanus vaccine. If insurance declines coverage for vaccine, please contact health department in county of residence to schedule an appointment for vaccine. $35 [place], $37 [place].    Kim Cole , Thank you for taking time to come for your Medicare Wellness Visit. I appreciate your ongoing commitment to your health goals. Please review the following plan we discussed and let me know if I can assist you in the future.   These are the goals we discussed: Goals    . Patient Stated     Starting 07/22/17, I will continue to take medications as prescribed.        This is a list of the screening recommended for you and due dates:  Health Maintenance  Topic Date Due  . Complete foot exam   07/30/2017*  . Mammogram  07/23/2018*  . Eye exam for diabetics  07/23/2018*  . Tetanus Vaccine  07/23/2018*  . Flu Shot  10/22/2017  . Hemoglobin A1C  01/22/2018  . Colon Cancer Screening  09/09/2020  . DEXA scan (bone density measurement)  Completed  .  Hepatitis C: One time screening is recommended by Center for Disease Control  (CDC) for  adults born from [birth year] through 1964.   Completed  . Pneumonia vaccines  Completed  *Topic was postponed. The date shown is not the original due date.   Preventive Care for Adults  A healthy lifestyle and preventive care can promote health and wellness. Preventive health guidelines for adults include the following key practices.  . A routine yearly physical is a good way to check with your health care provider about your health and preventive screening. It is a chance to share any concerns and updates on your health and to receive a thorough exam.  . Visit your dentist for a routine exam and preventive care every 6 months. Brush your teeth twice a day and floss once a day. Good oral hygiene prevents tooth decay and gum disease.  . The frequency of eye exams is based on your age, health, family medical history,  use  of contact lenses, and other factors. Follow your health care provider's recommendations for frequency of eye exams.  . Eat a healthy diet. Foods like vegetables, fruits, whole grains, low-fat dairy products, and lean protein foods contain the nutrients you need without too many calories. Decrease your intake of foods high in solid fats, added sugars, and salt. Eat the right amount of calories for you. Get information about a proper diet from your health care provider, if necessary.  . Regular physical exercise is one of the most important things you can do for your health. Most adults should get at least 150 minutes of moderate-intensity exercise (any activity that increases your heart rate and causes you to sweat) each week. In addition, most adults need muscle-strengthening exercises on 2 or more days a week.  Silver Sneakers may be a benefit available to you. To determine eligibility, you may visit the website: www.silversneakers.com or contact program at 309-167-7405 Mon-Fri between 8AM-8PM.   . Maintain a healthy weight. The body mass index (BMI) is a screening tool to identify possible weight problems. It provides an estimate of body fat based on height and weight. Your health care provider can find your BMI and can help you achieve or maintain a healthy weight.   For adults 20 years and older: ? A BMI below 18.5 is considered underweight. ? A BMI of 18.5 to 24.9 is normal. ?  A BMI of 25 to 29.9 is considered overweight. ? A BMI of 30 and above is considered obese.   . Maintain normal blood lipids and cholesterol levels by exercising and minimizing your intake of saturated fat. Eat a balanced diet with plenty of fruit and vegetables. Blood tests for lipids and cholesterol should begin at age 31 and be repeated every 5 years. If your lipid or cholesterol levels are high, you are over 50, or you are at high risk for heart disease, you may need your cholesterol levels checked more  frequently. Ongoing high lipid and cholesterol levels should be treated with medicines if diet and exercise are not working.  . If you smoke, find out from your health care provider how to quit. If you do not use tobacco, please do not start.  . If you choose to drink alcohol, please do not consume more than 2 drinks per day. One drink is considered to be 12 ounces (355 mL) of beer, 5 ounces (148 mL) of wine, or 1.5 ounces (44 mL) of liquor.  . If you are 76-77 years old, ask your health care provider if you should take aspirin to prevent strokes.  . Use sunscreen. Apply sunscreen liberally and repeatedly throughout the day. You should seek shade when your shadow is shorter than you. Protect yourself by wearing long sleeves, pants, a wide-brimmed hat, and sunglasses year round, whenever you are outdoors.  . Once a month, do a whole body skin exam, using a mirror to look at the skin on your back. Tell your health care provider of new moles, moles that have irregular borders, moles that are larger than a pencil eraser, or moles that have changed in shape or color.

## 2017-07-24 ENCOUNTER — Other Ambulatory Visit: Payer: Self-pay | Admitting: Family Medicine

## 2017-07-30 ENCOUNTER — Encounter: Payer: Self-pay | Admitting: Family Medicine

## 2017-07-30 ENCOUNTER — Other Ambulatory Visit: Payer: Self-pay

## 2017-07-30 ENCOUNTER — Ambulatory Visit (INDEPENDENT_AMBULATORY_CARE_PROVIDER_SITE_OTHER): Payer: PPO | Admitting: Family Medicine

## 2017-07-30 ENCOUNTER — Telehealth: Payer: Self-pay | Admitting: Family Medicine

## 2017-07-30 VITALS — BP 140/80 | HR 68 | Temp 98.2°F | Ht 64.5 in | Wt 172.5 lb

## 2017-07-30 DIAGNOSIS — Z0000 Encounter for general adult medical examination without abnormal findings: Secondary | ICD-10-CM | POA: Diagnosis not present

## 2017-07-30 MED ORDER — LISINOPRIL 10 MG PO TABS: 10.0000 mg | ORAL_TABLET | Freq: Every day | ORAL | 3 refills | Status: DC

## 2017-07-30 MED ORDER — ACCU-CHEK FASTCLIX LANCETS MISC: 3 refills | Status: DC

## 2017-07-30 MED ORDER — ACCU-CHEK NANO SMARTVIEW W/DEVICE KIT: PACK | 0 refills | Status: DC

## 2017-07-30 MED ORDER — GLUCOSE BLOOD VI STRP: ORAL_STRIP | 3 refills | Status: DC

## 2017-07-30 NOTE — Patient Instructions (Signed)
Dr. Lou Miner at [facility]  Dr. Lenward Chancellor at [facility]

## 2017-07-30 NOTE — Telephone Encounter (Signed)
Copied from [place] [id number]. Topic: Quick Communication - See Telephone Encounter >> Jul 30, 2017 12:26 PM Robina Ade, Helene Kelp D wrote: CRM for notification. See Telephone encounter for: 07/30/17. Patient called and said that he had an appt today and that she just want to make sure that he medication that Dr. Lorelei Pont send in for her is sent to [facility] and not [facility] pharmacy please.

## 2017-07-30 NOTE — Progress Notes (Signed)
Dr. [name] T. Bich Mchaney, MD, [facility] [place], [address] Phone: [phone] Fax: [phone]  07/30/2017  Patient: Kim Cole, MRN: [id number], DOB: 09-15-1945, 72 y.o.  Primary Physician:  Owens Loffler, MD   Chief Complaint  Patient presents with  . Annual Exam    Part 2   Subjective:   Kim Cole is a 72 y.o. pleasant patient who presents with the following:  Health Maintenance Summary Reviewed and updated, unless pt declines services.  Tobacco History Reviewed. Non-smoker Alcohol: No concerns, no excessive use Exercise Habits: Some activity, rec at least 30 mins 5 times a week STD concerns: none Drug Use: None Birth control method: n/a Menses regular: n/a Lumps or breast concerns: no Breast Cancer Family History: no  Retired last year.    L hip - having a lot of pain in the left hip, has known severe OA  Health Maintenance  Topic Date Due  . FOOT EXAM  07/30/2017 (Originally 07/18/2017)  . MAMMOGRAM  07/23/2018 (Originally 04/19/2017)  . OPHTHALMOLOGY EXAM  07/23/2018 (Originally 03/31/2016)  . TETANUS/TDAP  07/23/2018 (Originally 01/12/1966)  . INFLUENZA VACCINE  10/22/2017  . HEMOGLOBIN A1C  01/22/2018  . COLONOSCOPY  09/09/2020  . DEXA SCAN  Completed  . Hepatitis C Screening  Completed  . PNA vac Low Risk Adult  Completed    Immunization History  Administered Date(s) Administered  . Influenza, High Dose Seasonal PF 04/26/2015, 01/12/2016, 02/07/2017  . Pneumococcal Conjugate-13 11/16/2013  . Pneumococcal Polysaccharide-23 10/11/2012   Patient Active Problem List   Diagnosis Date Noted  . Diabetes mellitus type 2 in obese ([place]) 11/17/2013    Priority: High  . Hyperlipidemia     Priority: Medium  . Hypertension     Priority: Medium  . Hypothyroid     Priority: Medium  . Obesity (BMI 30-39.9) 11/22/2013  . Arthritis   . Allergic rhinitis due to pollen   .  Migraine    Past Medical History:  Diagnosis Date  . Allergic rhinitis due to pollen   . Anemia   . Arthritis   . Diabetes mellitus type 2 in obese ([place]) 11/17/2013  . Hyperlipidemia   . Hypertension   . Hypothyroid   . Migraine    NONE RECENT   Past Surgical History:  Procedure Laterality Date  . COLONOSCOPY WITH PROPOFOL N/A 09/10/2015   Procedure: COLONOSCOPY WITH PROPOFOL;  Surgeon: Garlan Fair, MD;  Location: [facility];  Service: Endoscopy;  Laterality: N/A;  . COLONSCOPY  FEW YRS AGO   2 SMALL POLYPS  . TOTAL HIP ARTHROPLASTY Right 2011   Dr. Wynelle Link  . TUBAL LIGATION     Social History   Socioeconomic History  . Marital status: Widowed    Spouse name: Not on file  . Number of children: Not on file  . Years of education: Not on file  . Highest education level: Not on file  Occupational History    Employer: [employer]  Social Needs  . Financial resource strain: Not on file  . Food insecurity:    Worry: Not on file    Inability: Not on file  . Transportation needs:    Medical: Not on file    Non-medical: Not on file  Tobacco Use  . Smoking status: Never Smoker  . Smokeless tobacco: Never Used  Substance and Sexual Activity  . Alcohol use: Yes    Comment: SELDOM  . Drug use:  No  . Sexual activity: Not Currently  Lifestyle  . Physical activity:    Days per week: Not on file    Minutes per session: Not on file  . Stress: Not on file  Relationships  . Social connections:    Talks on phone: Not on file    Gets together: Not on file    Attends religious service: Not on file    Active member of club or organization: Not on file    Attends meetings of clubs or organizations: Not on file    Relationship status: Not on file  . Intimate partner violence:    Fear of current or ex partner: Not on file    Emotionally abused: Not on file    Physically abused: Not on file    Forced sexual activity: Not on file  Other Topics Concern  . Not on  file  Social History Narrative   Worked 40 years for [employer] / [employer]   Married   Children and Tanazia Achee mother   History reviewed. No pertinent family history. No Known Allergies  Medication list has been reviewed and updated.   General: Denies fever, chills, sweats. No significant weight loss. Eyes: Denies blurring,significant itching ENT: Denies earache, sore throat, and hoarseness.  Cardiovascular: Denies chest pains, palpitations, dyspnea on exertion,  Respiratory: Denies cough, dyspnea at rest,wheeezing Breast: no concerns about lumps GI: Denies nausea, vomiting, diarrhea, constipation, change in bowel habits, abdominal pain, melena, hematochezia GU: Denies dysuria, hematuria, urinary hesitancy, nocturia, denies STD risk, no concerns about discharge Musculoskeletal: Denies back pain, joint pain Derm: Denies rash, itching Neuro: Denies  paresthesias, frequent falls, frequent headaches Psych: Denies depression, anxiety Endocrine: Denies cold intolerance, heat intolerance, polydipsia Heme: Denies enlarged lymph nodes Allergy: No hayfever  Objective:   BP 140/80   Pulse 68   Temp 98.2 F (36.8 C) (Oral)   Ht 5' 4.5" (1.638 m)   Wt 172 lb 8 oz (78.2 kg)   BMI 29.15 kg/m  No exam data present  GEN: well developed, well nourished, no acute distress Eyes: conjunctiva and lids normal, PERRLA, EOMI ENT: TM clear, nares clear, oral exam WNL Neck: supple, no lymphadenopathy, no thyromegaly, no JVD Pulm: clear to auscultation and percussion, respiratory effort normal CV: regular rate and rhythm, S1-S2, no murmur, rub or gallop, no bruits Chest: no scars, masses, no lumps BREAST: breast exam declined GI: soft, non-tender; no hepatosplenomegaly, masses; active bowel sounds all quadrants GU: GU exam declined Lymph: no cervical, axillary or inguinal adenopathy  MSK: Patient's right hip, after hip arthroplasty has  normal motion in all directions.  On the left hip the patient is able to abduct only about 15 degrees.  Flexion only about 60 degrees with terminal endpoint.  At this point the patient is able to internally and externally rotate the hip none.  SKIN: clear, good turgor, color WNL, no rashes, lesions, or ulcerations Neuro: normal mental status, normal strength, sensation, and motion Psych: alert; oriented to person, place and time, normally interactive and not anxious or depressed in appearance.   All labs reviewed with patient. Lipids:    Component Value Date/Time   CHOL 130 07/22/2017 0859   TRIG 108.0 07/22/2017 0859   HDL 47.80 07/22/2017 0859   VLDL 21.6 07/22/2017 0859   CHOLHDL 3 07/22/2017 0859   CBC: CBC Latest Ref Rng & Units 07/22/2017 03/27/2015 06/21/2012  WBC 4.0 - 10.5 K/uL 4.5 4.4 7.8  Hemoglobin 12.0 -  15.0 g/dL 13.1 13.6 13.8  Hematocrit 36.0 - 46.0 % 38.2 41.2 41.0  Platelets 150.0 - 400.0 K/uL 186.0 198.0 725.3    Basic Metabolic Panel:    Component Value Date/Time   NA 140 07/22/2017 0859   K 4.0 07/22/2017 0859   CL 102 07/22/2017 0859   CO2 30 07/22/2017 0859   BUN 12 07/22/2017 0859   CREATININE 0.96 07/22/2017 0859   GLUCOSE 135 (H) 07/22/2017 0859   CALCIUM 9.8 07/22/2017 0859   Hepatic Function Latest Ref Rng & Units 07/22/2017 07/17/2016 03/27/2015  Total Protein 6.0 - 8.3 g/dL 6.9 7.6 6.6  Albumin 3.5 - 5.2 g/dL 4.2 4.6 4.1  AST 0 - 37 U/L 21 19 20   ALT 0 - 35 U/L 21 17 22   Alk Phosphatase 39 - 117 U/L 66 97 85  Total Bilirubin 0.2 - 1.2 mg/dL 0.8 0.7 0.5  Bilirubin, Direct 0.0 - 0.3 mg/dL 0.2 0.1 0.1    Lab Results  Component Value Date   TSH 0.77 07/22/2017   No results found.  Assessment and Plan:   Healthcare maintenance  Overall doing well.  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   I am doubtful that anything short of total hip arthroplasty would provide her significant relief.  She was going to  go visit some of the for profit clinics that have recently come to [facility] promoting stem cell or PRP.  My recommendation was to discuss this with 1 of my colleagues at [facility] or [facility] who is 1 of the leading researchers in this area.  Patient Instructions  Dr. Lou Miner at [facility]  Dr. Lenward Chancellor at [facility] Maintenance Exam: The patient's preventative maintenance and recommended screening tests for an annual wellness exam were reviewed in full today. Brought up to date unless services declined.  Counselled on the importance of diet, exercise, and its role in overall health and mortality. The patient's FH and SH was reviewed, including their home life, tobacco status, and drug and alcohol status.  Follow-up in 1 year for physical exam or additional follow-up below.  Follow-up: No follow-ups on file. Or follow-up in 1 year if not noted.  Signed,  Maud Deed. Sibbie Flammia, MD   Allergies as of 07/30/2017   No Known Allergies     Medication List        Accurate as of 07/30/17 11:12 AM. Always use your most recent med list.          ACCU-CHEK FASTCLIX LANCETS Misc Use to check blood sugar once daily.  Dx: E11.9   cholecalciferol 1000 units tablet Commonly known as:  VITAMIN D Take 1,000 Units by mouth daily.   EYE VITAMINS Tabs Take 1 tablet by mouth daily.   GLUCOSAMINE CHONDROITIN VIT D3 Caps Take by mouth.   glucose blood test strip Commonly known as:  ACCU-CHEK SMARTVIEW Use to check blood sugar once daily.  Dx: E11.9   levothyroxine 88 MCG tablet Commonly known as:  SYNTHROID, LEVOTHROID Take 1 tablet by mouth daily before breakfast   lisinopril-hydrochlorothiazide 10-12.5 MG tablet Commonly known as:  PRINZIDE,ZESTORETIC Take 1 tablet by mouth daily   metFORMIN 500 MG tablet Commonly known as:  GLUCOPHAGE Take 1 tablet by mouth daily with breakfast.   simvastatin 20 MG tablet Commonly known as:  ZOCOR Take 1 tablet by mouth every evening     vitamin B-12 1000 MCG tablet Commonly known as:  CYANOCOBALAMIN Take 1,000 mcg  by mouth daily.   vitamin E 400 UNIT capsule Take 400 Units by mouth daily.

## 2017-07-30 NOTE — Telephone Encounter (Addendum)
Pharmacy updated.  Rx for Lisinopril, Accu-Chek Nano Meter, Test Strips and Lancets sent to [facility].

## 2017-09-22 DIAGNOSIS — D225 Melanocytic nevi of trunk: Secondary | ICD-10-CM | POA: Diagnosis not present

## 2017-09-22 DIAGNOSIS — D2261 Melanocytic nevi of right upper limb, including shoulder: Secondary | ICD-10-CM | POA: Diagnosis not present

## 2017-09-22 DIAGNOSIS — M85852 Other specified disorders of bone density and structure, left thigh: Secondary | ICD-10-CM | POA: Diagnosis not present

## 2017-09-22 DIAGNOSIS — Z1231 Encounter for screening mammogram for malignant neoplasm of breast: Secondary | ICD-10-CM | POA: Diagnosis not present

## 2017-09-22 DIAGNOSIS — C44519 Basal cell carcinoma of skin of other part of trunk: Secondary | ICD-10-CM | POA: Diagnosis not present

## 2017-09-22 DIAGNOSIS — L821 Other seborrheic keratosis: Secondary | ICD-10-CM | POA: Diagnosis not present

## 2017-09-22 DIAGNOSIS — D485 Neoplasm of uncertain behavior of skin: Secondary | ICD-10-CM | POA: Diagnosis not present

## 2017-09-22 DIAGNOSIS — D1801 Hemangioma of skin and subcutaneous tissue: Secondary | ICD-10-CM | POA: Diagnosis not present

## 2017-09-22 LAB — HM DEXA SCAN

## 2017-09-28 ENCOUNTER — Encounter: Payer: Self-pay | Admitting: Family Medicine

## 2017-10-02 ENCOUNTER — Other Ambulatory Visit: Payer: Self-pay | Admitting: Family Medicine

## 2018-03-25 ENCOUNTER — Other Ambulatory Visit: Payer: Self-pay | Admitting: Family Medicine

## 2018-04-28 ENCOUNTER — Other Ambulatory Visit: Payer: Self-pay | Admitting: *Deleted

## 2018-04-28 MED ORDER — LEVOTHYROXINE SODIUM 88 MCG PO TABS: ORAL_TABLET | ORAL | 0 refills | Status: DC

## 2018-07-21 ENCOUNTER — Telehealth: Payer: Self-pay | Admitting: Family Medicine

## 2018-07-21 MED ORDER — LEVOTHYROXINE SODIUM 88 MCG PO TABS: ORAL_TABLET | ORAL | 0 refills | Status: DC

## 2018-07-21 NOTE — Telephone Encounter (Signed)
Caller: Alda Lea Relationship to patient: Best Number: Pharmacy:[facility]- same as previous refill  Reason for call: Levothyroxine 11mcg refill, pt has 5 left

## 2018-07-21 NOTE — Telephone Encounter (Signed)
Refill sent as requested. 

## 2018-07-30 ENCOUNTER — Other Ambulatory Visit: Payer: Self-pay | Admitting: *Deleted

## 2018-07-30 MED ORDER — LISINOPRIL 10 MG PO TABS: 10.0000 mg | ORAL_TABLET | Freq: Every day | ORAL | 0 refills | Status: DC

## 2018-08-02 ENCOUNTER — Ambulatory Visit: Payer: PPO

## 2018-08-04 ENCOUNTER — Encounter: Payer: PPO | Admitting: Family Medicine

## 2018-08-23 ENCOUNTER — Telehealth: Payer: Self-pay | Admitting: Family Medicine

## 2018-08-23 NOTE — Telephone Encounter (Signed)
Left message asking pt to call office please r/s 6/18 appointment with dr copland  Also medicare wellness with lisa needs to be virtual or phone visit.  Please get information

## 2018-09-02 ENCOUNTER — Ambulatory Visit: Payer: PPO

## 2018-09-09 ENCOUNTER — Encounter: Payer: PPO | Admitting: Family Medicine

## 2018-10-06 ENCOUNTER — Other Ambulatory Visit: Payer: Self-pay | Admitting: *Deleted

## 2018-10-06 MED ORDER — METFORMIN HCL 500 MG PO TABS: 500.0000 mg | ORAL_TABLET | Freq: Every day | ORAL | 0 refills | Status: DC

## 2018-10-06 NOTE — Telephone Encounter (Signed)
Received faxed refill request from pharmacy for Metformin. Refill sent electronically. Patient has an upcoming appointment scheduled 11/10/2018.

## 2018-10-07 ENCOUNTER — Other Ambulatory Visit: Payer: Self-pay | Admitting: *Deleted

## 2018-10-07 MED ORDER — LISINOPRIL 10 MG PO TABS: 10.0000 mg | ORAL_TABLET | Freq: Every day | ORAL | 0 refills | Status: DC

## 2018-10-07 NOTE — Telephone Encounter (Signed)
Received faxed refill request on Lisinopril. Refill sent to pharmacy electronically. Patient has an upcoming appointment scheduled.

## 2018-10-25 ENCOUNTER — Other Ambulatory Visit: Payer: Self-pay | Admitting: *Deleted

## 2018-10-25 MED ORDER — LEVOTHYROXINE SODIUM 88 MCG PO TABS: ORAL_TABLET | ORAL | 0 refills | Status: DC

## 2018-10-25 MED ORDER — METFORMIN HCL 500 MG PO TABS: 500.0000 mg | ORAL_TABLET | Freq: Every day | ORAL | 0 refills | Status: DC

## 2018-10-25 MED ORDER — SIMVASTATIN 20 MG PO TABS: 20.0000 mg | ORAL_TABLET | Freq: Every evening | ORAL | 0 refills | Status: DC

## 2018-11-05 ENCOUNTER — Ambulatory Visit (INDEPENDENT_AMBULATORY_CARE_PROVIDER_SITE_OTHER): Payer: PPO

## 2018-11-05 ENCOUNTER — Ambulatory Visit: Payer: PPO

## 2018-11-05 VITALS — Ht 65.5 in | Wt 145.0 lb

## 2018-11-05 DIAGNOSIS — Z0000 Encounter for general adult medical examination without abnormal findings: Secondary | ICD-10-CM | POA: Diagnosis not present

## 2018-11-05 NOTE — Progress Notes (Signed)
Subjective:   Kim Cole is a 73 y.o. female who presents for Medicare Annual (Subsequent) preventive examination.  This visit type was conducted due to national recommendations for restrictions regarding the COVID-19 Pandemic (e.g. social distancing). This format is felt to be most appropriate for this patient at this time. All issues noted in this document were discussed and addressed. No physical exam was performed (except for noted visual exam findings with Video Visits). This patient, Kim Cole, has given permission to perform this visit via telephone. Vital signs may be absent or patient reported.  Patient location:  At home  Nurse location:  At home     Review of Systems:  N/a  Cardiac Risk Factors include: advanced age (>40mn, >>88women);diabetes mellitus;dyslipidemia;hypertension;sedentary lifestyle     Objective:     Vitals: Ht 5' 5.5" (1.664 m) Comment: per patient  Wt 145 lb (65.8 kg) Comment: per patient  BMI 23.76 kg/m   Body mass index is 23.76 kg/m.  Advanced Directives 11/05/2018 07/22/2017 09/10/2015 09/07/2015  Does Patient Have a Medical Advance Directive? Yes Yes Yes Yes  Type of [place]amed[place]of [place] Living will;Healthcare Power of Attorney  Does patient want to make changes to medical advance directive? - - - No - Patient declined  Copy of HHarrisin Chart? No - copy requested No - copy requested No - copy requested No - copy requested    Tobacco Social History   Tobacco Use  Smoking Status Never Smoker  Smokeless Tobacco Never Used     Counseling given: Not Answered   Clinical Intake:  Pre-visit preparation completed: Yes  Pain : No/denies pain     Nutritional Status: BMI of 19-24  Normal Nutritional Risks: None Diabetes: Yes CBG done?: No Did pt. bring in CBG monitor from home?:  No  How often do you need to have someone help you when you read instructions, pamphlets, or other written materials from your doctor or pharmacy?: 1 - Never What is the last grade level you completed in school?: 12th grade     Information entered by :: NAllen LPN  Past Medical History:  Diagnosis Date  . Allergic rhinitis due to pollen   . Anemia   . Arthritis   . Diabetes mellitus type 2 in obese ([place] 11/17/2013  . Hyperlipidemia   . Hypertension   . Hypothyroid   . Migraine    NONE RECENT   Past Surgical History:  Procedure Laterality Date  . COLONOSCOPY WITH PROPOFOL N/A 09/10/2015   Procedure: COLONOSCOPY WITH PROPOFOL;  Surgeon: MGarlan Fair MD;  Location: [facility];  Service: Endoscopy;  Laterality: N/A;  . COLONSCOPY  FEW YRS AGO   2 SMALL POLYPS  . TOTAL HIP ARTHROPLASTY Right 2011   Dr. AWynelle Link . TUBAL LIGATION     Family History  Problem Relation Age of Onset  . Diabetes Mother   . Diabetes Father    Social History   Socioeconomic History  . Marital status: Widowed    Spouse name: Not on file  . Number of children: Not on file  . Years of education: Not on file  . Highest education level: Not on file  Occupational History  . 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 Financial resource strain: Not hard at all  . Food insecurity    Worry: Never true  Inability: Never true  . Transportation needs    Medical: No    Non-medical: No  Tobacco Use  . Smoking status: Never Smoker  . Smokeless tobacco: Never Used  Substance and Sexual Activity  . Alcohol use: Not Currently    Comment: SELDOM  . Drug use: No  . Sexual activity: Not Currently  Lifestyle  . Physical activity    Days per week: 0 days    Minutes per session: 0 min  . Stress: Not at all  Relationships  . Social Herbalist on phone: Not on file    Gets together: Not on file    Attends religious service: Not on file    Active member of  club or organization: Not on file    Attends meetings of clubs or organizations: Not on file    Relationship status: Not on file  Other Topics Concern  . Not on file  Social History Narrative   Worked 40 years for [employer] / [employer]   Married   Children and Evangeline Utley mother    Outpatient Encounter Medications as of 11/05/2018  Medication Sig  . ACCU-CHEK FASTCLIX LANCETS MISC Use to check blood sugar once daily.  Dx: E11.9  . Blood Glucose Monitoring Suppl (ACCU-CHEK NANO SMARTVIEW) w/Device KIT Use to check blood sugar once daily.  Dx: E11.9  . cholecalciferol (VITAMIN D) 1000 UNITS tablet Take 1,000 Units by mouth daily.  Marland Kitchen glucose blood (ACCU-CHEK SMARTVIEW) test strip Use to check blood sugar once daily.  Dx: E11.9  . levothyroxine (SYNTHROID) 88 MCG tablet Take 1 tablet by mouth daily before breakfast  . lisinopril (ZESTRIL) 10 MG tablet Take 1 tablet (10 mg total) by mouth daily.  . metFORMIN (GLUCOPHAGE) 500 MG tablet Take 1 tablet (500 mg total) by mouth daily with breakfast.  . Misc Natural Products (GLUCOSAMINE CHONDROITIN VIT D3) CAPS Take by mouth.  . simvastatin (ZOCOR) 20 MG tablet Take 1 tablet (20 mg total) by mouth every evening.  . Multiple Vitamins-Minerals (EYE VITAMINS) TABS Take 1 tablet by mouth daily.  . vitamin B-12 (CYANOCOBALAMIN) 1000 MCG tablet Take 1,000 mcg by mouth daily.  . vitamin E 400 UNIT capsule Take 400 Units by mouth daily.   No facility-administered encounter medications on file as of 11/05/2018.     Activities of Daily Living In your present state of health, do you have any difficulty performing the following activities: 11/05/2018  Hearing? N  Vision? N  Difficulty concentrating or making decisions? N  Walking or climbing stairs? N  Dressing or bathing? N  Doing errands, shopping? N  Preparing Food and eating ? N  Using the Toilet? N  In the past six months, have you accidently  leaked urine? Y  Comment sometimes wears a panty liner  Do you have problems with loss of bowel control? N  Managing your Medications? N  Managing your Finances? N  Housekeeping or managing your Housekeeping? N  Some recent data might be hidden    Patient Care Team: Owens Loffler, MD as PCP - General (Family Medicine)    Assessment:   This is a routine wellness examination for Kim Cole.  Exercise Activities and Dietary recommendations Current Exercise Habits: The patient does not participate in regular exercise at present  Goals    . Patient Stated     Starting 07/22/17, I will continue to take medications as prescribed.     . Patient Stated  11/05/2018, trying to do better health wise       Fall Risk Fall Risk  11/05/2018 07/22/2017 07/17/2016 03/29/2015 11/16/2013  Falls in the past year? 0 Yes Yes No No  Comment - fell while doing yardwork; no injury - - -  Number falls in past yr: - 1 1 - -  Injury with Fall? - No No - -  Risk for fall due to : Medication side effect - - - -  Follow up Falls evaluation completed;Falls prevention discussed - - - -   Is the patient's home free of loose throw rugs in walkways, pet beds, electrical cords, etc?   yes      Grab bars in the bathroom? no      Handrails on the stairs?   yes      Adequate lighting?   yes  Timed Get Up and Go performed: n/a  Depression Screen PHQ 2/9 Scores 11/05/2018 07/22/2017 07/17/2016 03/29/2015  PHQ - 2 Score 0 0 0 0  PHQ- 9 Score 0 0 - -     Cognitive Function MMSE - Mini Mental State Exam 11/05/2018 07/22/2017  Orientation to time 5 5  Orientation to Place 5 5  Registration 3 3  Attention/ Calculation 5 0  Recall 1 1  Recall-comments forgot 2 unable to recall 2 of 3 words  Language- name 2 objects 0 0  Language- repeat 1 1  Language- follow 3 step command 0 3  Language- read & follow direction 0 0  Write a sentence 0 0  Copy design 0 0  Total score 20 18   Mini Cog  Mini-Cog screen was  completed. Maximum score is 22. A value of 0 denotes this part of the MMSE was not completed or the patient failed this part of the Mini-Cog screening.       Immunization History  Administered Date(s) Administered  . Influenza, High Dose Seasonal PF 04/26/2015, 01/12/2016, 02/07/2017, 01/18/2018  . Pneumococcal Conjugate-13 11/16/2013  . Pneumococcal Polysaccharide-23 10/11/2012    Qualifies for Shingles Vaccine? yes  Screening Tests Health Maintenance  Topic Date Due  . TETANUS/TDAP  01/12/1966  . OPHTHALMOLOGY EXAM  03/31/2016  . FOOT EXAM  07/18/2017  . HEMOGLOBIN A1C  01/22/2018  . INFLUENZA VACCINE  10/23/2018  . MAMMOGRAM  09/23/2019  . DEXA SCAN  09/23/2019  . COLONOSCOPY  09/09/2020  . Hepatitis C Screening  Completed  . PNA vac Low Risk Adult  Completed    Cancer Screenings: Lung: Low Dose CT Chest recommended if Age 69-80 years, 30 pack-year currently smoking OR have quit w/in 15years. Patient does not qualify. Breast:  Up to date on Mammogram? Yes   Up to date of Bone Density/Dexa? Yes Colorectal: up to date  Additional Screenings: : Hepatitis C Screening: 06/2016     Plan:    Patient wants to do better health wise. Has been losing weight.   I have personally reviewed and noted the following in the patient's chart:   . Medical and social history . Use of alcohol, tobacco or illicit drugs  . Current medications and supplements . Functional ability and status . Nutritional status . Physical activity . Advanced directives . List of other physicians . Hospitalizations, surgeries, and ER visits in previous 12 months . Vitals . Screenings to include cognitive, depression, and falls . Referrals and appointments  In addition, I have reviewed and discussed with patient certain preventive protocols, quality metrics, and best practice recommendations. A written personalized care  plan for preventive services as well as general preventive health recommendations  were provided to patient.     Kellie Simmering, LPN  08/09/3435

## 2018-11-05 NOTE — Patient Instructions (Signed)
Kim Cole , Thank you for taking time to come for your Medicare Wellness Visit. I appreciate your ongoing commitment to your health goals. Please review the following plan we discussed and let me know if I can assist you in the future.   Screening recommendations/referrals: Colonoscopy: 08/2015 Mammogram: 09/2017 Bone Density: 09/2017 Recommended yearly ophthalmology/optometry visit for glaucoma screening and checkup Recommended yearly dental visit for hygiene and checkup  Vaccinations: Influenza vaccine: 12/2017 Pneumococcal vaccine: 10/2013 Tdap vaccine: checking with pharmacy Shingles vaccine: discussed    Advanced directives: Please bring a copy of your POA (Power of [place]) and/or Living Will to your next appointment.    Conditions/risks identified: none  Next appointment: 11/10/2018 at 10:20   Preventive Care 65 Years and Older, Female Preventive care refers to lifestyle choices and visits with your health care provider that can promote health and wellness. What does preventive care include?  A yearly physical exam. This is also called an annual well check.  Dental exams once or twice a year.  Routine eye exams. Ask your health care provider how often you should have your eyes checked.  Personal lifestyle choices, including:  Daily care of your teeth and gums.  Regular physical activity.  Eating a healthy diet.  Avoiding tobacco and drug use.  Limiting alcohol use.  Practicing safe sex.  Taking low-dose aspirin every day.  Taking vitamin and mineral supplements as recommended by your health care provider. What happens during an annual well check? The services and screenings done by your health care provider during your annual well check will depend on your age, overall health, lifestyle risk factors, and family history of disease. Counseling  Your health care provider may ask you questions about your:  Alcohol use.  Tobacco use.  Drug use.   Emotional well-being.  Home and relationship well-being.  Sexual activity.  Eating habits.  History of falls.  Memory and ability to understand (cognition).  Work and work Statistician.  Reproductive health. Screening  You may have the following tests or measurements:  Height, weight, and BMI.  Blood pressure.  Lipid and cholesterol levels. These may be checked every 5 years, or more frequently if you are over 72 years old.  Skin check.  Lung cancer screening. You may have this screening every year starting at age 72 if you have a 30-pack-year history of smoking and currently smoke or have quit within the past 15 years.  Fecal occult blood test (FOBT) of the stool. You may have this test every year starting at age 72.  Flexible sigmoidoscopy or colonoscopy. You may have a sigmoidoscopy every 5 years or a colonoscopy every 10 years starting at age 72.  Hepatitis C blood test.  Hepatitis B blood test.  Sexually transmitted disease (STD) testing.  Diabetes screening. This is done by checking your blood sugar (glucose) after you have not eaten for a while (fasting). You may have this done every 1-3 years.  Bone density scan. This is done to screen for osteoporosis. You may have this done starting at age 72.  Mammogram. This may be done every 1-2 years. Talk to your health care provider about how often you should have regular mammograms. Talk with your health care provider about your test results, treatment options, and if necessary, the need for more tests. Vaccines  Your health care provider may recommend certain vaccines, such as:  Influenza vaccine. This is recommended every year.  Tetanus, diphtheria, and acellular pertussis (Tdap, Td) vaccine. You may need a  Td booster every 10 years.  Zoster vaccine. You may need this after age 72.  Pneumococcal 13-valent conjugate (PCV13) vaccine. One dose is recommended after age 72.  Pneumococcal polysaccharide (PPSV23)  vaccine. One dose is recommended after age 72. Talk to your health care provider about which screenings and vaccines you need and how often you need them. This information is not intended to replace advice given to you by your health care provider. Make sure you discuss any questions you have with your health care provider. Document Released: 04/06/2015 Document Revised: 11/28/2015 Document Reviewed: 01/09/2015 Elsevier Interactive Patient Education  2017 [place] Prevention in the Home Falls can cause injuries. They can happen to people of all ages. There are many things you can do to make your home safe and to help prevent falls. What can I do on the outside of my home?  Regularly fix the edges of walkways and driveways and fix any cracks.  Remove anything that might make you trip as you walk through a door, such as a raised step or threshold.  Trim any bushes or trees on the path to your home.  Use bright outdoor lighting.  Clear any walking paths of anything that might make someone trip, such as rocks or tools.  Regularly check to see if handrails are loose or broken. Make sure that both sides of any steps have handrails.  Any raised decks and porches should have guardrails on the edges.  Have any leaves, snow, or ice cleared regularly.  Use sand or salt on walking paths during winter.  Clean up any spills in your garage right away. This includes oil or grease spills. What can I do in the bathroom?  Use night lights.  Install grab bars by the toilet and in the tub and shower. Do not use towel bars as grab bars.  Use non-skid mats or decals in the tub or shower.  If you need to sit down in the shower, use a plastic, non-slip stool.  Keep the floor dry. Clean up any water that spills on the floor as soon as it happens.  Remove soap buildup in the tub or shower regularly.  Attach bath mats securely with double-sided non-slip rug tape.  Do not have throw rugs  and other things on the floor that can make you trip. What can I do in the bedroom?  Use night lights.  Make sure that you have a light by your bed that is easy to reach.  Do not use any sheets or blankets that are too big for your bed. They should not hang down onto the floor.  Have a firm chair that has side arms. You can use this for support while you get dressed.  Do not have throw rugs and other things on the floor that can make you trip. What can I do in the kitchen?  Clean up any spills right away.  Avoid walking on wet floors.  Keep items that you use a lot in easy-to-reach places.  If you need to reach something above you, use a strong step stool that has a grab bar.  Keep electrical cords out of the way.  Do not use floor polish or wax that makes floors slippery. If you must use wax, use non-skid floor wax.  Do not have throw rugs and other things on the floor that can make you trip. What can I do with my stairs?  Do not leave any items on the stairs.  Make sure that there are handrails on both sides of the stairs and use them. Fix handrails that are broken or loose. Make sure that handrails are as long as the stairways.  Check any carpeting to make sure that it is firmly attached to the stairs. Fix any carpet that is loose or worn.  Avoid having throw rugs at the top or bottom of the stairs. If you do have throw rugs, attach them to the floor with carpet tape.  Make sure that you have a light switch at the top of the stairs and the bottom of the stairs. If you do not have them, ask someone to add them for you. What else can I do to help prevent falls?  Wear shoes that:  Do not have high heels.  Have rubber bottoms.  Are comfortable and fit you well.  Are closed at the toe. Do not wear sandals.  If you use a stepladder:  Make sure that it is fully opened. Do not climb a closed stepladder.  Make sure that both sides of the stepladder are locked into  place.  Ask someone to hold it for you, if possible.  Clearly mark and make sure that you can see:  Any grab bars or handrails.  First and last steps.  Where the edge of each step is.  Use tools that help you move around (mobility aids) if they are needed. These include:  Canes.  Walkers.  Scooters.  Crutches.  Turn on the lights when you go into a dark area. Replace any light bulbs as soon as they burn out.  Set up your furniture so you have a clear path. Avoid moving your furniture around.  If any of your floors are uneven, fix them.  If there are any pets around you, be aware of where they are.  Review your medicines with your doctor. Some medicines can make you feel dizzy. This can increase your chance of falling. Ask your doctor what other things that you can do to help prevent falls. This information is not intended to replace advice given to you by your health care provider. Make sure you discuss any questions you have with your health care provider. Document Released: 01/04/2009 Document Revised: 08/16/2015 Document Reviewed: 04/14/2014 Elsevier Interactive Patient Education  2017 [employer].

## 2018-11-05 NOTE — Progress Notes (Signed)
PCP notes:  Health Maintenance:  Tetanus due. (Patient going to check with pharmacy) Foot exam, eye exam and A1C are due.  Abnormal Screenings:  None  Patient concerns:  None  Nurse concerns:  None  Next PCP appt.: 11/10/2018 at 10:20

## 2018-11-10 ENCOUNTER — Other Ambulatory Visit: Payer: Self-pay

## 2018-11-10 ENCOUNTER — Encounter: Payer: Self-pay | Admitting: Family Medicine

## 2018-11-10 ENCOUNTER — Ambulatory Visit (INDEPENDENT_AMBULATORY_CARE_PROVIDER_SITE_OTHER): Payer: PPO | Admitting: Family Medicine

## 2018-11-10 VITALS — BP 120/74 | HR 97 | Temp 98.0°F | Ht 64.5 in | Wt 142.8 lb

## 2018-11-10 DIAGNOSIS — E669 Obesity, unspecified: Secondary | ICD-10-CM

## 2018-11-10 DIAGNOSIS — Z0000 Encounter for general adult medical examination without abnormal findings: Secondary | ICD-10-CM | POA: Diagnosis not present

## 2018-11-10 DIAGNOSIS — E1169 Type 2 diabetes mellitus with other specified complication: Secondary | ICD-10-CM | POA: Diagnosis not present

## 2018-11-10 DIAGNOSIS — I10 Essential (primary) hypertension: Secondary | ICD-10-CM | POA: Diagnosis not present

## 2018-11-10 DIAGNOSIS — E038 Other specified hypothyroidism: Secondary | ICD-10-CM

## 2018-11-10 LAB — CBC WITH DIFFERENTIAL/PLATELET
Basophils Absolute: 0 10*3/uL (ref 0.0–0.1)
Basophils Relative: 0.5 % (ref 0.0–3.0)
Eosinophils Absolute: 0 10*3/uL (ref 0.0–0.7)
Eosinophils Relative: 0.8 % (ref 0.0–5.0)
HCT: 41.4 % (ref 36.0–46.0)
Hemoglobin: 13.9 g/dL (ref 12.0–15.0)
Lymphocytes Relative: 26.5 % (ref 12.0–46.0)
Lymphs Abs: 1.4 10*3/uL (ref 0.7–4.0)
MCHC: 33.6 g/dL (ref 30.0–36.0)
MCV: 88.1 fl (ref 78.0–100.0)
Monocytes Absolute: 0.4 10*3/uL (ref 0.1–1.0)
Monocytes Relative: 8.4 % (ref 3.0–12.0)
Neutro Abs: 3.3 10*3/uL (ref 1.4–7.7)
Neutrophils Relative %: 63.8 % (ref 43.0–77.0)
Platelets: 192 10*3/uL (ref 150.0–400.0)
RBC: 4.71 Mil/uL (ref 3.87–5.11)
RDW: 14.3 % (ref 11.5–15.5)
WBC: 5.1 10*3/uL (ref 4.0–10.5)

## 2018-11-10 LAB — MICROALBUMIN / CREATININE URINE RATIO
Creatinine,U: 97.5 mg/dL
Microalb Creat Ratio: 1.7 mg/g (ref 0.0–30.0)
Microalb, Ur: 1.6 mg/dL (ref 0.0–1.9)

## 2018-11-10 LAB — LIPID PANEL
Cholesterol: 136 mg/dL (ref 0–200)
HDL: 53.9 mg/dL (ref >39.00)
LDL Cholesterol: 62 mg/dL (ref 0–99)
NonHDL: 81.86
Total CHOL/HDL Ratio: 3
Triglycerides: 101 mg/dL (ref 0.0–149.0)
VLDL: 20.2 mg/dL (ref 0.0–40.0)

## 2018-11-10 LAB — HEPATIC FUNCTION PANEL
ALT: 12 U/L (ref 0–35)
AST: 14 U/L (ref 0–37)
Albumin: 4.5 g/dL (ref 3.5–5.2)
Alkaline Phosphatase: 80 U/L (ref 39–117)
Bilirubin, Direct: 0.2 mg/dL (ref 0.0–0.3)
Total Bilirubin: 1.1 mg/dL (ref 0.2–1.2)
Total Protein: 6.7 g/dL (ref 6.0–8.3)

## 2018-11-10 LAB — BASIC METABOLIC PANEL
BUN: 12 mg/dL (ref 6–23)
CO2: 30 mEq/L (ref 19–32)
Calcium: 10.2 mg/dL (ref 8.4–10.5)
Chloride: 103 mEq/L (ref 96–112)
Creatinine, Ser: 0.92 mg/dL (ref 0.40–1.20)
GFR: 60.04 mL/min (ref >60.00)
Glucose, Bld: 124 mg/dL — ABNORMAL HIGH (ref 70–99)
Potassium: 3.5 mEq/L (ref 3.5–5.1)
Sodium: 142 mEq/L (ref 135–145)

## 2018-11-10 LAB — T4, FREE: Free T4: 1.56 ng/dL (ref 0.60–1.60)

## 2018-11-10 LAB — T3, FREE: T3, Free: 2.9 pg/mL (ref 2.3–4.2)

## 2018-11-10 LAB — HEMOGLOBIN A1C: Hgb A1c MFr Bld: 5.1 % (ref 4.6–6.5)

## 2018-11-10 LAB — TSH: TSH: 0.24 u[IU]/mL — ABNORMAL LOW (ref 0.35–4.50)

## 2018-11-10 NOTE — Progress Notes (Signed)
Kim Cole T. Yarelin Reichardt, MD [facility] and [place] [place], [address] Phone: [phone]  FAX: Early - 74 y.o. female  MRN [id number]  Date of Birth: [birth year]  Visit Date: 11/10/2018  PCP: Owens Loffler, MD  Referred by: Owens Loffler, MD  Chief Complaint  Patient presents with  . Annual Exam    Part 2   Patient Care Team: Owens Loffler, MD as PCP - General (Family Medicine) Subjective:   Kim Cole is a 73 y.o. pleasant patient who presents with the following:  Health Maintenance Summary Reviewed and updated, unless pt declines services.  Tobacco History Reviewed. Non-smoker Alcohol: No concerns, no excessive use Exercise Habits: Some activity, rec at least 30 mins 5 times a week STD concerns: none Drug Use: None Birth control method: n/a Menses regular: no Lumps or breast concerns: no Breast Cancer Family History: no  Lab Results  Component Value Date   HGBA1C 5.1 11/10/2018    Order all labs  Some gardening.   Has not been   Health Maintenance  Topic Date Due  . TETANUS/TDAP  01/12/1966  . OPHTHALMOLOGY EXAM  03/31/2016  . FOOT EXAM  07/18/2017  . INFLUENZA VACCINE  10/23/2018  . HEMOGLOBIN A1C  05/13/2019  . MAMMOGRAM  09/23/2019  . DEXA SCAN  09/23/2019  . COLONOSCOPY  09/09/2020  . Hepatitis C Screening  Completed  . PNA vac Low Risk Adult  Completed    Immunization History  Administered Date(s) Administered  . Influenza, High Dose Seasonal PF 04/26/2015, 01/12/2016, 02/07/2017, 01/18/2018  . Pneumococcal Conjugate-13 11/16/2013  . Pneumococcal Polysaccharide-23 10/11/2012   Patient Active Problem List   Diagnosis Date Noted  . Diabetes mellitus type 2 in obese ([place]) 11/17/2013    Priority: High  . Hyperlipidemia     Priority: Medium  . Hypertension     Priority: Medium  . Hypothyroid     Priority: Medium  .  Arthritis   . Allergic rhinitis due to pollen   . Migraine    Past Medical History:  Diagnosis Date  . Allergic rhinitis due to pollen   . Anemia   . Arthritis   . Diabetes mellitus type 2 in obese ([place]) 11/17/2013  . Hyperlipidemia   . Hypertension   . Hypothyroid   . Migraine    NONE RECENT   Past Surgical History:  Procedure Laterality Date  . COLONOSCOPY WITH PROPOFOL N/A 09/10/2015   Procedure: COLONOSCOPY WITH PROPOFOL;  Surgeon: Garlan Fair, MD;  Location: [facility] END[facility]COPY;  Service: Endoscopy;  Laterality: N/A;  . COLONSCOPY  FEW YRS AGO   2 SMALL POLYPS  . TOTAL HIP ARTHROPLASTY Right 2011   Dr. Wynelle Link  . TUBAL LIGATION     Social History   Socioeconomic History  . Marital status: Widowed    Spouse name: Not on file  . Number of children: Not on file  . Years of education: Not on file  . Highest education level: Not on file  Occupational History  . Occupation: retired    Fish farm manager: Engineer, manufacturing systems  Social Needs  . Financial resource strain: Not hard at all  . Food insecurity    Worry: Never true    Inability: Never true  . Transportation needs    Medical: No    Non-medical: No  Tobacco Use  . Smoking status: Never Smoker  . Smokeless tobacco: Never Used  Substance and  Sexual Activity  . Alcohol use: Not Currently    Comment: S[place]OM  . Drug use: No  . Sexual activity: Not Currently  Lifestyle  . Physical activity    Days per week: 0 days    Minutes per session: 0 min  . Stress: Not at all  Relationships  . Social Herbalist on phone: Not on file    Gets together: Not on file    Attends religious service: Not on file    Active member of club or organization: Not on file    Attends meetings of clubs or organizations: Not on file    Relationship status: Not on file  . Intimate partner violence    Fear of current or ex partner: No    Emotionally abused: No    Physically abused: No    Forced sexual activity: No  Other Topics  Concern  . Not on file  Social History Narrative   Worked 36 years for [employer]   Married   Children and Glynda Jaeger Bujak's mother   Family History  Problem Relation Age of Onset  . Diabetes Mother   . Diabetes Father    No Known Allergies  Medication list has been reviewed and updated.   General: Denies fever, chills, sweats. No significant weight loss. Eyes: Denies blurring,significant itching ENT: Denies earache, sore throat, and hoarseness.  Cardiovascular: Denies chest pains, palpitations, dyspnea on exertion,  Respiratory: Denies cough, dyspnea at rest,wheeezing Breast: no concerns about lumps GI: Denies nausea, vomiting, diarrhea, constipation, change in bowel habits, abdominal pain, melena, hematochezia GU: Denies dysuria, hematuria, urinary hesitancy, nocturia, denies STD risk, no concerns about discharge Musculoskeletal: Denies back pain, joint pain Derm: Denies rash, itching Neuro: Denies  paresthesias, frequent falls, frequent headaches Psych: Denies depression, anxiety Endocrine: Denies cold intolerance, heat intolerance, polydipsia Heme: Denies enlarged lymph nodes Allergy: No hayfever  Objective:   BP 120/74   Pulse 97   Temp 98 F (36.7 C) (Temporal)   Ht 5' 4.5" (1.638 m)   Wt 142 lb 12 oz (64.8 kg)   SpO2 96%   BMI 24.12 kg/m  Ideal Body Weight: Weight in (lb) to have BMI = 25: 147.6 No exam data present Depression screen [facility] 2/9 11/05/2018 07/22/2017 07/17/2016 03/29/2015 11/16/2013  Decreased Interest 0 0 0 0 0  Down, Depressed, Hopeless 0 0 0 0 0  PHQ - 2 Score 0 0 0 0 0  Altered sleeping 0 0 - - -  Tired, decreased energy 0 0 - - -  Change in appetite 0 0 - - -  Feeling bad or failure about yourself  0 0 - - -  Trouble concentrating 0 0 - - -  Moving slowly or fidgety/restless 0 0 - - -  Suicidal thoughts 0 0 - - -  PHQ-9 Score 0 0 - - -  Difficult doing work/chores - Not difficult at  all - - -     GEN: well developed, well nourished, no acute distress Eyes: conjunctiva and lids normal, PERRLA, EOMI ENT: TM clear, nares clear, oral exam WNL Neck: supple, no lymphadenopathy, no thyromegaly, no JVD Pulm: clear to auscultation and percussion, respiratory effort normal CV: regular rate and rhythm, S1-S2, no murmur, rub or gallop, no bruits Chest: no scars, masses, no lumps BREAST: breast exam declined GI: soft, non-tender; no hepatosplenomegaly, masses; active bowel sounds all quadrants GU: GU exam declined Lymph: no cervical, axillary or inguinal  adenopathy MSK: gait normal, muscle tone and strength WNL, no joint swelling, effusions, discoloration, crepitus  SKIN: clear, good turgor, color WNL, no rashes, lesions, or ulcerations Neuro: normal mental status, normal strength, sensation, and motion Psych: alert; oriented to person, place and time, normally interactive and not anxious or depressed in appearance.   All labs reviewed with patient. Results for orders placed or performed in visit on 93/23/55  Basic metabolic panel  Result Value Ref Range   Sodium 142 135 - 145 mEq/L   Potassium 3.5 3.5 - 5.1 mEq/L   Chloride 103 96 - 112 mEq/L   CO2 30 19 - 32 mEq/L   Glucose, Bld 124 (H) 70 - 99 mg/dL   BUN 12 6 - 23 mg/dL   Creatinine, Ser 0.92 0.40 - 1.20 mg/dL   Calcium 10.2 8.4 - 10.5 mg/dL   GFR 60.04 >60.00 mL/min  CBC with Differential/Platelet  Result Value Ref Range   WBC 5.1 4.0 - 10.5 K/uL   RBC 4.71 3.87 - 5.11 Mil/uL   Hemoglobin 13.9 12.0 - 15.0 g/dL   HCT 41.4 36.0 - 46.0 %   MCV 88.1 78.0 - 100.0 fl   MCHC 33.6 30.0 - 36.0 g/dL   RDW 14.3 11.5 - 15.5 %   Platelets 192.0 150.0 - 400.0 K/uL   Neutrophils Relative % 63.8 43.0 - 77.0 %   Lymphocytes Relative 26.5 12.0 - 46.0 %   Monocytes Relative 8.4 3.0 - 12.0 %   Eosinophils Relative 0.8 0.0 - 5.0 %   Basophils Relative 0.5 0.0 - 3.0 %   Neutro Abs 3.3 1.4 - 7.7 K/uL   Lymphs Abs 1.4 0.7 -  4.0 K/uL   Monocytes Absolute 0.4 0.1 - 1.0 K/uL   Eosinophils Absolute 0.0 0.0 - 0.7 K/uL   Basophils Absolute 0.0 0.0 - 0.1 K/uL  Hepatic function panel  Result Value Ref Range   Total Bilirubin 1.1 0.2 - 1.2 mg/dL   Bilirubin, Direct 0.2 0.0 - 0.3 mg/dL   Alkaline Phosphatase 80 39 - 117 U/L   AST 14 0 - 37 U/L   ALT 12 0 - 35 U/L   Total Protein 6.7 6.0 - 8.3 g/dL   Albumin 4.5 3.5 - 5.2 g/dL  Lipid panel  Result Value Ref Range   Cholesterol 136 0 - 200 mg/dL   Triglycerides 101.0 0.0 - 149.0 mg/dL   HDL 53.90 >39.00 mg/dL   VLDL 20.2 0.0 - 40.0 mg/dL   LDL Cholesterol 62 0 - 99 mg/dL   Total CHOL/HDL Ratio 3    NonHDL 81.86   Hemoglobin A1c  Result Value Ref Range   Hgb A1c MFr Bld 5.1 4.6 - 6.5 %  Microalbumin / creatinine urine ratio  Result Value Ref Range   Microalb, Ur 1.6 0.0 - 1.9 mg/dL   Creatinine,U 97.5 mg/dL   Microalb Creat Ratio 1.7 0.0 - 30.0 mg/g  TSH  Result Value Ref Range   TSH 0.24 (L) 0.35 - 4.50 uIU/mL  T3, free  Result Value Ref Range   T3, Free 2.9 2.3 - 4.2 pg/mL  T4, free  Result Value Ref Range   Free T4 1.56 0.60 - 1.60 ng/dL   No results found.  Assessment and Plan:     ICD-10-CM   1. Healthcare maintenance  D32.20 Basic metabolic panel    CBC with Differential/Platelet    Hepatic function panel    Lipid panel    Hemoglobin A1c    Microalbumin /  creatinine urine ratio    TSH    T3, free    T4, free  2. Diabetes mellitus type 2 in obese (H[facility])  E11.69 Hemoglobin A1c   E66.9 Microalbumin / creatinine urine ratio  3. Essential hypertension  I10   4. Other specified hypothyroidism  E03.8 TSH    T3, free    T4, free   She is doing really well with basically no complaints and great weight loss.  Health Maintenance Exam: The patient's preventative maintenance and recommended screening tests for an annual wellness exam were reviewed in full today. Brought up to date unless services declined.  Counselled on the importance of  diet, exercise, and its role in overall health and mortality. The patient's FH and SH was reviewed, including their home life, tobacco status, and drug and alcohol status.  Follow-up in 1 year for physical exam or additional follow-up below.  Follow-up: No follow-ups on file. Or follow-up in 1 year if not noted.  No future appointments.  No orders of the defined types were placed in this encounter.  There are no discontinued medications. Orders Placed This Encounter  Procedures  . Basic metabolic panel  . CBC with Differential/Platelet  . Hepatic function panel  . Lipid panel  . Hemoglobin A1c  . Microalbumin / creatinine urine ratio  . TSH  . T3, free  . T4, free    Signed,  Laurell Coalson T. Marlyn Tondreau, MD   Allergies as of 11/10/2018   No Known Allergies     Medication List       Accurate as of November 10, 2018 11:59 PM. If you have any questions, ask your nurse or doctor.        Accu-Chek FastClix Lancets Misc Use to check blood sugar once daily.  Dx: E11.9   Accu-Chek Nano SmartView w/Device Kit Use to check blood sugar once daily.  Dx: E11.9   cholecalciferol 1000 units tablet Commonly known as: VITAMIN D Take 1,000 Units by mouth daily.   Eye Vitamins Tabs Take 1 tablet by mouth daily.   Glucosamine Chondroitin Vit D3 Caps Take by mouth.   glucose blood test strip Commonly known as: Accu-Chek SmartView Use to check blood sugar once daily.  Dx: E11.9   levothyroxine 88 MCG tablet Commonly known as: SYNTHROID Take 1 tablet by mouth daily before breakfast   lisinopril 10 MG tablet Commonly known as: ZESTRIL Take 1 tablet (10 mg total) by mouth daily.   metFORMIN 500 MG tablet Commonly known as: GLUCOPHAGE Take 1 tablet (500 mg total) by mouth daily with breakfast.   simvastatin 20 MG tablet Commonly known as: ZOCOR Take 1 tablet (20 mg total) by mouth every evening.   vitamin B-12 1000 MCG tablet Commonly known as: CYANOCOBALAMIN Take 1,000 mcg  by mouth daily.   vitamin E 400 UNIT capsule Take 400 Units by mouth daily.

## 2018-12-14 ENCOUNTER — Telehealth: Payer: Self-pay | Admitting: Family Medicine

## 2018-12-14 NOTE — Telephone Encounter (Signed)
Handicap Placard Application completed and placed in Dr. [name]'s in box for signature. 

## 2018-12-14 NOTE — Telephone Encounter (Signed)
Pt dropped off disability parking placard form to be filled out. Placed in [place] tower for Dr. Lorelei Pont. Pt asks if you will please call her to pick up form.  CB  [phone]

## 2018-12-15 NOTE — Telephone Encounter (Signed)
noted 

## 2018-12-17 NOTE — Telephone Encounter (Signed)
Kim Cole notified that her handicap placard application is ready to be picked up at the front desk.

## 2019-01-12 ENCOUNTER — Telehealth: Payer: Self-pay | Admitting: Family Medicine

## 2019-01-12 NOTE — Telephone Encounter (Signed)
Patient called to let Dr.[name] know she received the high dose flu shot at [facility] on [id number].

## 2019-01-12 NOTE — Telephone Encounter (Signed)
Immunization record updated.

## 2019-02-09 ENCOUNTER — Other Ambulatory Visit: Payer: Self-pay | Admitting: *Deleted

## 2019-02-09 MED ORDER — LEVOTHYROXINE SODIUM 88 MCG PO TABS: ORAL_TABLET | ORAL | 2 refills | Status: DC

## 2019-02-09 MED ORDER — SIMVASTATIN 20 MG PO TABS: 20.0000 mg | ORAL_TABLET | Freq: Every evening | ORAL | 2 refills | Status: DC

## 2019-02-09 MED ORDER — LISINOPRIL 10 MG PO TABS: 10.0000 mg | ORAL_TABLET | Freq: Every day | ORAL | 1 refills | Status: DC

## 2019-02-09 MED ORDER — METFORMIN HCL 500 MG PO TABS: 500.0000 mg | ORAL_TABLET | Freq: Every day | ORAL | 1 refills | Status: DC

## 2019-08-17 ENCOUNTER — Other Ambulatory Visit: Payer: Self-pay | Admitting: Family Medicine

## 2019-10-03 ENCOUNTER — Encounter: Payer: Self-pay | Admitting: Family Medicine

## 2019-10-03 ENCOUNTER — Ambulatory Visit (INDEPENDENT_AMBULATORY_CARE_PROVIDER_SITE_OTHER): Payer: PPO | Admitting: Family Medicine

## 2019-10-03 ENCOUNTER — Telehealth: Payer: Self-pay

## 2019-10-03 ENCOUNTER — Other Ambulatory Visit: Payer: Self-pay

## 2019-10-03 VITALS — BP 102/60 | HR 82 | Temp 98.1°F | Ht 64.5 in | Wt 134.0 lb

## 2019-10-03 DIAGNOSIS — E669 Obesity, unspecified: Secondary | ICD-10-CM | POA: Diagnosis not present

## 2019-10-03 DIAGNOSIS — I10 Essential (primary) hypertension: Secondary | ICD-10-CM | POA: Diagnosis not present

## 2019-10-03 DIAGNOSIS — R42 Dizziness and giddiness: Secondary | ICD-10-CM

## 2019-10-03 DIAGNOSIS — E1169 Type 2 diabetes mellitus with other specified complication: Secondary | ICD-10-CM | POA: Diagnosis not present

## 2019-10-03 MED ORDER — LISINOPRIL 10 MG PO TABS: ORAL_TABLET | ORAL | Status: DC

## 2019-10-03 MED ORDER — METFORMIN HCL 500 MG PO TABS: ORAL_TABLET | ORAL | Status: DC

## 2019-10-03 NOTE — Patient Instructions (Addendum)
Go to the lab on the way out.   If you have mychart we'll likely use that to update you.    Stop the lisinopril and metformin for now.  Let me update Dr. Lorelei Pont.  Let us know how you feel in a few days off the medicines.  Drink enough water to keep your urine clear and take snacks during the day if you aren't eating a big meal.   Take care.  Glad to see you.

## 2019-10-03 NOTE — Telephone Encounter (Signed)
Per appt notes pt already has appt scheduled with Dr Damita Dunnings today at 2:30.

## 2019-10-03 NOTE — Progress Notes (Signed)
OtherThis visit occurred during the SARS-CoV-2 public health emergency.  Safety protocols were in place, including screening questions prior to the visit, additional usage of staff PPE, and extensive cleaning of exam room while observing appropriate contact time as indicated for disinfecting solutions.  She was getting out of bed to go to the BR late 7/10 vs early 7/11.  She thought she may have been "dizzy" and then fell.  She remembers hitting the floor.  No known LOC.  She has room spinning in the meantime with position change.  She didn't eat much yesterday.  She was able to get up on her own.  She didn't notify family until after sunrise on 7/11.    Son drove pt to OV today.  She feels better today, her balance is better today.    This was her first fall.  No known SZ activity.  No tongue biting.    She lives alone.  We talked about lightheaded vs vertigo.  She thought the sx were more like being lightheaded instead of vertigo.  She isn't sore other than her lower back and R elbow. Her pain is improving.    She hasn't been checking her sugar recently.   Weight is down but she isn't cooking as much as prev.  Weight loss noted.  She does not have fevers or night sweats "B" symptoms.  Meds, vitals, and allergies reviewed.   ROS: Per HPI unless specifically indicated in ROS section   GEN: nad, alert and oriented HEENT: TM wnl NECK: supple w/o LA CV: rrr PULM: ctab, no inc wob ABD: soft, +bs EXT: no edema SKIN: no acute rash CN 2-12 wnl B, S/S/DTR wnl x4

## 2019-10-03 NOTE — Telephone Encounter (Signed)
[place] Night - Client Nonclinical Telephone Record AccessNurse Client [facility] Night - Client Client Site [place] - Night Contact Type Call Who Is Calling Patient / Member / Family / Caregiver Caller Name [place] Phone Number [phone] Patient Name Kim Cole Patient DOB [birth year] Call Type Message Only Information Provided Reason for Call Request for General Office Information Initial Comment caller states his Mom fell Saturday and her back is hurting and also she gets dizzy when she stands Additional Comment office hours provided, message sent Disp. Time Disposition Final User 10/03/2019 8:19:15 AM General Information Provided Yes Birdena Jubilee Call Closed By: Birdena Jubilee Transaction Date/Time: 10/03/2019 8:17:27 AM (ET)

## 2019-10-03 NOTE — Telephone Encounter (Signed)
Will see today. Thanks

## 2019-10-03 NOTE — Telephone Encounter (Signed)
Kim Cole - Client TELEPHONE ADVICE RECORD AccessNurse Patient Name: Kim Cole Gender: Female DOB: 1945-08-31 Age: 74 Y 101 M 20 D Return Phone Number: [phone] (Primary), [phone] (Secondary) Address[place] [address] Client Kim Cole - Client Client Site [place] Physician Kim Cole - MD Contact Type Call Who Is Calling Patient / Member / Family / Caregiver Call Type Triage / Clinical Caller Name Kim Cole Relationship To Patient Son Return Phone Number [phone] (Primary) Chief Complaint Dizziness Reason for Call Request to Schedule Office Appointment Initial Comment caller states his Mom fell Saturday Cole. states room is spinning around her. states did not hit her head. back is sore Translation No Nurse Assessment Nurse: Kim Spore, RN, Kim Cole Date/Time (Eastern Time): 10/03/2019 8:54:07 AM Confirm and document reason for call. If symptomatic, describe symptoms. ---caller states fell on to floor Saturday. c/o back pain. caller states did not hit head. getting dizzy at times Has the patient had close contact with a person known or suspected to have the novel coronavirus illness OR traveled / lives in area with major community spread (including international travel) in the last 14 days from the onset of symptoms? * If Asymptomatic, screen for exposure and travel within the last 14 days. ---No Does the patient have any new or worsening symptoms? ---Yes Will a triage be completed? ---Yes Related visit to physician within the last 2 weeks? ---No Does the PT have any chronic conditions? (i.e. diabetes, asthma, this includes High risk factors for pregnancy, etc.) ---Yes List chronic conditions. ---hypertension Is this a behavioral health or substance abuse call? ---No Guidelines Guideline Title Affirmed Question Affirmed Notes  Nurse Date/Time (Eastern Time) Dizziness - Vertigo [1] Dizziness (vertigo) present now AND [2] one or more STROKE RISK FACTORS (i.e., hypertension, diabetes, prior stroke/TIA, heart Kim Spore, RN, Kim Cole 10/03/2019 8:57:22 AM PLEASE NOTE: All timestamps contained within this report are represented as [place] Standard Time. CONFIDENTIALTY NOTICE: This fax transmission is intended only for the addressee. It contains information that is legally privileged, confidential or otherwise protected from use or disclosure. If you are not the intended recipient, you are strictly prohibited from reviewing, disclosing, copying using or disseminating any of this information or taking any action in reliance on or regarding this information. If you have received this fax in error, please notify us immediately by telephone so that we can arrange for its return to [name]. Phone: [phone], Toll-Free: [phone], Fax: [phone] Page: 2 of 2 Call Id: [id number] Guidelines Guideline Title Affirmed Question Affirmed Notes Nurse Date/Time Kim Cole Time) attack) (Exception: prior physician evaluation for this AND no different/ worse than usual) Disp. Time Kim Cole Time) Disposition Final User 10/03/2019 9:03:00 AM Go to ED Now (or PCP triage) Yes Kim Spore, RN, Kim Cole Disagree/Comply Comply Caller Understands Yes PreDisposition Go to ED Care Advice Given Per Guideline GO TO ED NOW (OR PCP TRIAGE): NOTE TO TRIAGER - DRIVING: * Another adult should drive. CARE ADVICE given per Dizziness - Vertigo (Adult) guideline. Referrals GO TO FACILITY UNDECIDED

## 2019-10-04 LAB — TSH: TSH: 0.21 u[IU]/mL — ABNORMAL LOW (ref 0.35–4.50)

## 2019-10-04 LAB — CBC WITH DIFFERENTIAL/PLATELET
Basophils Absolute: 0.1 10*3/uL (ref 0.0–0.1)
Basophils Relative: 0.8 % (ref 0.0–3.0)
Eosinophils Absolute: 0 10*3/uL (ref 0.0–0.7)
Eosinophils Relative: 0.6 % (ref 0.0–5.0)
HCT: 39.7 % (ref 36.0–46.0)
Hemoglobin: 13.6 g/dL (ref 12.0–15.0)
Lymphocytes Relative: 20.1 % (ref 12.0–46.0)
Lymphs Abs: 1.6 10*3/uL (ref 0.7–4.0)
MCHC: 34.3 g/dL (ref 30.0–36.0)
MCV: 89.6 fl (ref 78.0–100.0)
Monocytes Absolute: 0.6 10*3/uL (ref 0.1–1.0)
Monocytes Relative: 7.2 % (ref 3.0–12.0)
Neutro Abs: 5.6 10*3/uL (ref 1.4–7.7)
Neutrophils Relative %: 71.3 % (ref 43.0–77.0)
Platelets: 190 10*3/uL (ref 150.0–400.0)
RBC: 4.43 Mil/uL (ref 3.87–5.11)
RDW: 14.3 % (ref 11.5–15.5)
WBC: 7.9 10*3/uL (ref 4.0–10.5)

## 2019-10-04 LAB — COMPREHENSIVE METABOLIC PANEL
ALT: 13 U/L (ref 0–35)
AST: 19 U/L (ref 0–37)
Albumin: 4.2 g/dL (ref 3.5–5.2)
Alkaline Phosphatase: 77 U/L (ref 39–117)
BUN: 12 mg/dL (ref 6–23)
CO2: 28 mEq/L (ref 19–32)
Calcium: 10 mg/dL (ref 8.4–10.5)
Chloride: 97 mEq/L (ref 96–112)
Creatinine, Ser: 0.88 mg/dL (ref 0.40–1.20)
GFR: 63.04 mL/min (ref >60.00)
Glucose, Bld: 128 mg/dL — ABNORMAL HIGH (ref 70–99)
Potassium: 3.9 mEq/L (ref 3.5–5.1)
Sodium: 136 mEq/L (ref 135–145)
Total Bilirubin: 1.2 mg/dL (ref 0.2–1.2)
Total Protein: 6.4 g/dL (ref 6.0–8.3)

## 2019-10-04 LAB — HEMOGLOBIN A1C: Hgb A1c MFr Bld: 4.7 % (ref 4.6–6.5)

## 2019-10-05 ENCOUNTER — Telehealth: Payer: Self-pay | Admitting: *Deleted

## 2019-10-05 DIAGNOSIS — R42 Dizziness and giddiness: Secondary | ICD-10-CM | POA: Insufficient documentation

## 2019-10-05 MED ORDER — LEVOTHYROXINE SODIUM 88 MCG PO TABS: ORAL_TABLET | ORAL | Status: DC

## 2019-10-05 NOTE — Telephone Encounter (Signed)
Patient's son (on [place]) called stating that his mom was in 10/03/19 and saw Dr. Damita Dunnings because of dizzy spells. Annie Main stated that her Lisinopril and Metformin was stopped. Patient's son stated that his mom seems to be doing fine when she is sitting around watching television. Annie Main stated that his mom got up during the night and had another dizzy spell.  Annie Main stated that his mom has not been checking her blood sugar or blood pressure. Annie Main stated that his mom has a blood pressure kit but he is unable to find it. Advised patient's son that it would be helpful to try and find the blood pressure kit and check her blood pressure when she has the dizzy spells. Steepen stated that he will check his mom's blood sugar and I will call him back to get the reading.

## 2019-10-05 NOTE — Telephone Encounter (Signed)
There are several issues to consider here.  It may take another day or so for her to eliminate enough lisinopril to raise her blood pressure more.  I would have expected her sugar to normalize off the Metformin at this point.  We need some extra data.  They should be able to get a blood pressure cuff over-the-counter if they cannot borrow one from a friend.    If they cannot find a sugar meter at home or borrow one then they can check with her insurance to see what meter is covered.  We can send that in if needed.  In the meantime I think it makes sense to rise slowly, drink enough fluid to keep her urine clear, and adjust her thyroid medication.  Her TSH is low so she is on too much thyroid medication.  This may or may not contribute to her current symptoms but I would decrease her total replacement.  My understanding is that she is taking 1 tab a day of levothyroxine.  I would continue with 1 tablet a day except take 1/2 tablet on Sundays.  She will then be taking a total of 6.5 tablets in a week.  I would do that for 2 months and then recheck a TSH here at the clinic.  I will defer the follow-up TSH lab order to her PCP.  If she is not improving in the next few days then please let us know.  Thanks.

## 2019-10-05 NOTE — Assessment & Plan Note (Signed)
Reasonable to check routine labs today.  See notes on labs.  With weight loss noted she may no longer need lisinopril or Metformin.  Would hold both in the meantime.  It could have been that she was hypoglycemic.  She also could have been relatively hypotensive given her lisinopril use.  Discussed checking her sugar and blood pressure at home, continue with liberal p.o. fluids and then update [name] as needed.  I will route this to PCP about her weight loss also.  I will defer to PCP about that.

## 2019-10-05 NOTE — Telephone Encounter (Signed)
Called Kim Cole back and was advised that they can not find her blood sugar kit or blood pressure kit. Advised patient's son that message will go back to Dr. Damita Dunnings for his review. Advised patient's son to make sure that his mom gets up slowly. ER precautions were given and Kim Cole verbalized understanding.

## 2019-10-06 ENCOUNTER — Other Ambulatory Visit: Payer: Self-pay | Admitting: Family Medicine

## 2019-10-06 DIAGNOSIS — E890 Postprocedural hypothyroidism: Secondary | ICD-10-CM

## 2019-10-06 NOTE — Telephone Encounter (Signed)
LVM for patient to schedule a 2 month non fasting lab appt

## 2019-10-06 NOTE — Telephone Encounter (Signed)
Pt's son, Annie Main, called stating he had not received a call back. She will decrease the levothyroxine as directed. She is feeling much better today. She has not had BP med or metformin. Her BP was 165/85 this morning so he gave her a lisinopril about 11am.  Made lab appt 12-07-19.

## 2019-10-06 NOTE — Telephone Encounter (Signed)
Can you help get her a lab appointment in 2 months to recheck her thyroid and let her know?  TSH, Free T4, Free T3: hypothyroidism

## 2019-10-06 NOTE — Telephone Encounter (Signed)
Left message on patient's son's voicemail to return call.

## 2019-10-07 NOTE — Telephone Encounter (Signed)
Left detailed message on voicemail.  

## 2019-10-07 NOTE — Telephone Encounter (Signed)
If blood pressure is consistently greater than 140/90, then reasonable to take 10 mg lisinopril daily.  I would stay off Metformin for now.  If she has lightheadedness with lisinopril 10 mg a day she can cut back to 5 mg a day please update PCP as needed.  Thanks.

## 2019-11-18 ENCOUNTER — Encounter: Payer: Self-pay | Admitting: Emergency Medicine

## 2019-11-18 ENCOUNTER — Telehealth: Payer: Self-pay

## 2019-11-18 ENCOUNTER — Other Ambulatory Visit: Payer: Self-pay

## 2019-11-18 ENCOUNTER — Ambulatory Visit
Admission: EM | Admit: 2019-11-18 | Discharge: 2019-11-18 | Disposition: A | Discharge status: Home / Self Care | Payer: PPO | Attending: Internal Medicine | Admitting: Internal Medicine

## 2019-11-18 DIAGNOSIS — R9341 Abnormal radiologic findings on diagnostic imaging of renal pelvis, ureter, or bladder: Secondary | ICD-10-CM | POA: Diagnosis not present

## 2019-11-18 DIAGNOSIS — E785 Hyperlipidemia, unspecified: Secondary | ICD-10-CM | POA: Diagnosis not present

## 2019-11-18 DIAGNOSIS — R296 Repeated falls: Secondary | ICD-10-CM | POA: Diagnosis not present

## 2019-11-18 DIAGNOSIS — N3090 Cystitis, unspecified without hematuria: Secondary | ICD-10-CM | POA: Diagnosis not present

## 2019-11-18 DIAGNOSIS — S0990XA Unspecified injury of head, initial encounter: Secondary | ICD-10-CM

## 2019-11-18 DIAGNOSIS — R531 Weakness: Secondary | ICD-10-CM | POA: Diagnosis not present

## 2019-11-18 DIAGNOSIS — R112 Nausea with vomiting, unspecified: Secondary | ICD-10-CM

## 2019-11-18 DIAGNOSIS — Z794 Long term (current) use of insulin: Secondary | ICD-10-CM | POA: Diagnosis not present

## 2019-11-18 DIAGNOSIS — R634 Abnormal weight loss: Secondary | ICD-10-CM | POA: Diagnosis not present

## 2019-11-18 DIAGNOSIS — R110 Nausea: Secondary | ICD-10-CM | POA: Diagnosis not present

## 2019-11-18 DIAGNOSIS — E119 Type 2 diabetes mellitus without complications: Secondary | ICD-10-CM | POA: Diagnosis not present

## 2019-11-18 DIAGNOSIS — E860 Dehydration: Secondary | ICD-10-CM

## 2019-11-18 DIAGNOSIS — I499 Cardiac arrhythmia, unspecified: Secondary | ICD-10-CM | POA: Diagnosis not present

## 2019-11-18 DIAGNOSIS — Z9181 History of falling: Secondary | ICD-10-CM | POA: Diagnosis not present

## 2019-11-18 DIAGNOSIS — I10 Essential (primary) hypertension: Secondary | ICD-10-CM | POA: Diagnosis not present

## 2019-11-18 DIAGNOSIS — Z20822 Contact with and (suspected) exposure to covid-19: Secondary | ICD-10-CM | POA: Diagnosis not present

## 2019-11-18 DIAGNOSIS — Z114 Encounter for screening for human immunodeficiency virus [HIV]: Secondary | ICD-10-CM | POA: Diagnosis not present

## 2019-11-18 DIAGNOSIS — I951 Orthostatic hypotension: Secondary | ICD-10-CM | POA: Diagnosis not present

## 2019-11-18 DIAGNOSIS — N3000 Acute cystitis without hematuria: Secondary | ICD-10-CM | POA: Diagnosis not present

## 2019-11-18 DIAGNOSIS — Z79899 Other long term (current) drug therapy: Secondary | ICD-10-CM | POA: Diagnosis not present

## 2019-11-18 DIAGNOSIS — E039 Hypothyroidism, unspecified: Secondary | ICD-10-CM | POA: Diagnosis not present

## 2019-11-18 MED ORDER — ONDANSETRON HCL 4 MG/5ML PO SOLN: 4.0000 mg | Freq: Once | ORAL | Status: DC

## 2019-11-18 MED ORDER — ONDANSETRON 4 MG PO TBDP: 4.0000 mg | ORAL_TABLET | Freq: Once | ORAL | Status: DC

## 2019-11-18 NOTE — Telephone Encounter (Signed)
Dr Lorelei Pont has already responded to this note.

## 2019-11-18 NOTE — ED Provider Notes (Signed)
[facility]    CSN: [id number] Arrival date & time: 11/18/19  1136      History   Chief Complaint Chief Complaint  Patient presents with  . Dizziness  . Headache    HPI Kim Cole is a 74 y.o. female. who presents with her son is is helping with the history with R lateral head HA on area she hit. She has been in bed x 4 days and had been vomiting, and she is hardly drinking. She lives on her own, and per her med box she missed taking her meds on Tuesday. She has been having more difficulty with her memory, and today while the son was not in the room yet, she could not recall which son brought her in, and a few minutes later remembered. Per her son Legrand Como, she has not been diagnosed with dementia or Alzheimers, but he suspects she may have dementia. She had a prior fall one month ago and was seen by her PCP. She has been loosing wt for a few months now.     Past Medical History:  Diagnosis Date  . Allergic rhinitis due to pollen   . Anemia   . Arthritis   . Diabetes mellitus type 2 in obese ([place]) 11/17/2013  . Hyperlipidemia   . Hypertension   . Hypothyroid   . Migraine    NONE RECENT    Patient Active Problem List   Diagnosis Date Noted  . Lightheaded 10/05/2019  . Diabetes mellitus type 2 in obese ([place]) 11/17/2013  . Arthritis   . Allergic rhinitis due to pollen   . Hyperlipidemia   . Hypertension   . Migraine   . Hypothyroid     Past Surgical History:  Procedure Laterality Date  . COLONOSCOPY WITH PROPOFOL N/A 09/10/2015   Procedure: COLONOSCOPY WITH PROPOFOL;  Surgeon: Garlan Fair, MD;  Location: [facility];  Service: Endoscopy;  Laterality: N/A;  . COLONSCOPY  FEW YRS AGO   2 SMALL POLYPS  . TOTAL HIP ARTHROPLASTY Right 2011   Dr. Wynelle Link  . TUBAL LIGATION      OB History   No obstetric history on file.      Home Medications    Prior to Admission medications   Medication Sig Start Date End Date Taking? Authorizing  Provider  cholecalciferol (VITAMIN D) 1000 UNITS tablet Take 1,000 Units by mouth daily.   Yes [provider]  levothyroxine (SYNTHROID) 88 MCG tablet Take 1 tablet by mouth daily before breakfast except for half tablet on Sundays.  Total of 6.5 tablets in a week. 10/05/19  Yes Tonia Ghent, MD  lisinopril (ZESTRIL) 10 MG tablet Held as of 10/03/19 10/03/19  Yes Tonia Ghent, MD  Multiple Vitamins-Minerals (EYE VITAMINS) TABS Take 1 tablet by mouth daily.   Yes [provider]  simvastatin (ZOCOR) 20 MG tablet Take 1 tablet (20 mg total) by mouth every evening. 02/09/19  Yes [name], [name], MD  vitamin B-12 (CYANOCOBALAMIN) 1000 MCG tablet Take 1,000 mcg by mouth daily.   Yes [provider]  vitamin E 400 UNIT capsule Take 400 Units by mouth daily.   Yes [provider]  ACCU-CHEK FASTCLIX LANCETS MISC Use to check blood sugar once daily.  Dx: E11.9 07/30/17   [name], [name], MD  Blood Glucose Monitoring Suppl (ACCU-CHEK NANO SMARTVIEW) w/Device KIT Use to check blood sugar once daily.  Dx: E11.9 07/30/17   [name], [name], MD  glucose blood (ACCU-CHEK SMARTVIEW) test strip  Use to check blood sugar once daily.  Dx: E11.9 07/30/17   Owens Loffler, MD  Misc Natural Products (GLUCOSAMINE CHONDROITIN VIT D3) CAPS Take by mouth.    [provider]    Family History Family History  Problem Relation Age of Onset  . Diabetes Mother   . Diabetes Father     Social History Social History   Tobacco Use  . Smoking status: Never Smoker  . Smokeless tobacco: Never Used  Vaping Use  . Vaping Use: Never used  Substance Use Topics  . Alcohol use: Not Currently    Comment: SELDOM  . Drug use: No     Allergies   Patient has no known allergies.   Review of Systems Review of Systems  Constitutional: Positive for activity change and appetite change. Negative for chills, diaphoresis, fatigue and fever.  HENT: Negative for congestion,  rhinorrhea, sore throat and trouble swallowing.   Eyes: Negative for discharge and visual disturbance.  Respiratory: Negative for cough, chest tightness and shortness of breath.   Cardiovascular: Negative for chest pain, palpitations and leg swelling.  Gastrointestinal: Positive for nausea and vomiting. Negative for abdominal pain and diarrhea.  Genitourinary: Negative for difficulty urinating.  Musculoskeletal: Negative for myalgias.  Skin: Negative for rash.  Neurological: Positive for headaches.  Hematological: Negative for adenopathy.  Psychiatric/Behavioral: Positive for confusion.       Poor memory, but seems worse this week     Physical Exam Triage Vital Signs ED Triage Vitals  Enc Vitals Group     BP 11/18/19 1200 128/90     Pulse Rate 11/18/19 1200 88     Resp 11/18/19 1200 14     Temp 11/18/19 1200 97.8 F (36.6 C)     Temp Source 11/18/19 1200 Oral     SpO2 11/18/19 1200 100 %     Weight 11/18/19 1155 134 lb 0.6 oz (60.8 kg)     Height 11/18/19 1155 5' 5"  (1.651 m)     Head Circumference --      Peak Flow --      Pain Score 11/18/19 1155 0     Pain Loc --      Pain Edu? --      Excl. in [place]? --    No data found.  Updated Vital Signs BP 128/90 (BP Location: Right Arm)   Pulse 88   Temp 97.8 F (36.6 C) (Oral)   Resp 14   Ht 5' 5"  (1.651 m)   Wt 134 lb 0.6 oz (60.8 kg)   SpO2 100%   BMI 22.31 kg/m   Visual Acuity Right Eye Distance:   Left Eye Distance:   Bilateral Distance:    Right Eye Near:   Left Eye Near:    Bilateral Near:     Physical Exam Vitals and nursing note reviewed.  Constitutional:      Appearance: She is ill-appearing.     Comments: She is sitting on a wheelchair since feels weak and light headed  HENT:     Head: Atraumatic.     Comments: Has tender area on R parietal region. Temporal artery not hard or tender Eyes:     Extraocular Movements: Extraocular movements intact.     Pupils: Pupils are equal, round, and reactive to  light.  Neck:     Comments: Cervical vertebrae is not tender Cardiovascular:     Rate and Rhythm: Normal rate and regular rhythm.  Pulmonary:     Effort: Pulmonary effort  is normal.     Breath sounds: Normal breath sounds.  Abdominal:     General: Bowel sounds are normal. There is no distension.     Palpations: Abdomen is soft.     Tenderness: There is no abdominal tenderness.  Musculoskeletal:        General: Normal range of motion.     Cervical back: Neck supple. No rigidity.  Skin:    General: Skin is warm and dry.  Neurological:     Mental Status: She is alert. She is confused.     Cranial Nerves: No facial asymmetry.     Sensory: No sensory deficit.     Motor: No weakness.     Comments: Has asked me the same question x 6 times  Psychiatric:        Mood and Affect: Mood is depressed. Mood is not anxious.        Speech: Speech normal.        Behavior: Behavior is not agitated.    UC Treatments / Results  Labs (all labs ordered are listed, but only abnormal results are displayed) Labs Reviewed - No data to display  EKG   Radiology No results found.  Procedures Procedures (including critical care time)  Medications Ordered in UC Medications - No data to display  Initial Impression / Assessment and Plan / UC Course  I have reviewed the triage vital signs and the nursing notes. She look ill and I explained to her and so she needs more than what we can do for her. I believe she needs to be admitted. Son decided to take her to [facility] ER.  Final Clinical Impressions(s) / UC Diagnoses   Final diagnoses:  None   Discharge Instructions   None    ED Prescriptions    None     PDMP not reviewed this encounter.   Shelby Mattocks, PA-C 11/18/19 1315

## 2019-11-18 NOTE — Discharge Instructions (Addendum)
Please go to the ER with your son for more work up, with your bad headache, weight loss, memory problems worse than usual and vomiting and not eating or drinking all week. I believe you need to be admitted to the hospital

## 2019-11-18 NOTE — Telephone Encounter (Signed)
I think that is reasonable.  Ongoing headaches and dizziness after head injury would be fairly common, but sitting in bed all week and not eating or drinking would not go along with prior head injury.  ER is appropriate.

## 2019-11-18 NOTE — Telephone Encounter (Signed)
Kim Cole Day - Client TELEPHONE ADVICE RECORD AccessNurse Patient Name: Kim Cole Gender: Female DOB: [birth year] Age: 74 Y 10 M 5 D Return Phone Number: [phone] (Primary) Address[place] [address] Client [name] - Client Client Site [name] - Day Physician Cole, Kim Hamman - MD Contact Type Call Who Is Calling Patient / Member / Family / Caregiver Call Type Triage / Clinical Caller Name Kim Cole Relationship To Patient Son Return Phone Number [phone] (Primary) Chief Complaint HEAD INJURY - and not acting right. Change in behaviour after hitting head. Reason for Call Symptomatic / Request for [place] states mother fell a month ago, hit head, headaches, vomiting, nauseas, and dizzy. Its happened since fall. Translation No Nurse Assessment Nurse: Kim Negus, RN, Kim Cole Date/Time Kim Cole Time): 11/18/2019 8:22:35 AM Confirm and document reason for call. If symptomatic, describe symptoms. ---She fell a month ago, hit head, headaches, vomiting, nauseas, and dizzy. Its happened since fall. She is having memory loss, forgetting to take her meds. When she gets up she gets vey nauseated. She was seen in the office. She has lost a lot weight. Has the patient had close contact with a person known or suspected to have the novel coronavirus illness OR traveled / lives in area with major community spread (including international travel) in the last 14 days from the onset of symptoms? * If Asymptomatic, screen for exposure and travel within the last 14 days. ---No Does the patient have any new or worsening symptoms? ---Yes Will a triage be completed? ---Yes Related visit to physician within the last 2 weeks? ---No Does the PT have any chronic conditions? (i.e. diabetes, asthma, this includes High risk factors for pregnancy,  etc.) ---No Is this a behavioral health or substance abuse call? ---No Guidelines Guideline Title Affirmed Question Affirmed Notes Nurse Date/Time (Eastern Time) Traumatic Brain Injury More than 14 Days Ago Follow-up Call [1] SEVERE headache (e.g., excruciating) AND [2] "worst headache" of life Kim Cole, [name] 11/18/2019 8:25:08 AM PLEASE NOTE: All timestamps contained within this report are represented as [place] Standard Time. CONFIDENTIALTY NOTICE: This fax transmission is intended only for the addressee. It contains information that is legally privileged, confidential or otherwise protected from use or disclosure. If you are not the intended recipient, you are strictly prohibited from reviewing, disclosing, copying using or disseminating any of this information or taking any action in reliance on or regarding this information. If you have received this fax in error, please notify us immediately by telephone so that we can arrange for its return to [name]. Phone: [phone], Toll-Free: [phone], Fax: [phone] Page: 2 of 2 Call Id: [id number] Kim Cole. Time Kim Cole Time) Disposition Final User 11/18/2019 8:19:42 AM Send to Urgent Kim Cole 11/18/2019 8:36:46 AM Go to ED Now (or PCP triage) Yes Kim Negus, RN, Kim Cole Disagree/Comply Disagree Caller Understands No PreDisposition Call Doctor Care Advice Given Per Guideline GO TO ED NOW (OR PCP TRIAGE): CARE ADVICE given per Traumatic Brain Injury Follow-Up (Adult) guideline. Referrals GO TO FACILITY REFUSED

## 2019-11-18 NOTE — ED Triage Notes (Signed)
Patient c/o headache off and on for over a week.  Patient also reports dizziness that started on Tuesday.  Patient reports vomiting yesterday.  Patient reports loss of appetite.

## 2019-11-18 NOTE — Telephone Encounter (Signed)
Kim Cole at front desk said access nurse was on phone had advised pt needed to be seen in ED and refused and wanted me to speak with pts son and convince them to go to ED. I was on phone with another pt and when I finished that call there was no one on phone from access nurse. I called and spoke with Kim Cole (DPR signed) about 1 month ago pt fell and hit her head Kim Cole is not sure if pt lost consciousness when fell and hit head but thinks mom did lose consciousness due to pt only remembered waking up on floor) and pt remains dizzy on and off. Pt went to her knees last wk. This week pt has been forgetting things and pt complains of H/a, heaving, and dizzy. Pt has been in bed all wk and not eating and drinking. I advised Kim Cole pt could be dehydrated and may need imaging of head and needs to go to ED.Kim Cole is going to get his mom up and if cannot get in car will call 911. Kim Cole will cb with what facility he is taking his mom. FYI to Dr Lorelei Pont.

## 2019-11-19 DIAGNOSIS — W19XXXA Unspecified fall, initial encounter: Secondary | ICD-10-CM | POA: Diagnosis not present

## 2019-11-19 DIAGNOSIS — N3000 Acute cystitis without hematuria: Secondary | ICD-10-CM | POA: Diagnosis not present

## 2019-11-19 DIAGNOSIS — I951 Orthostatic hypotension: Secondary | ICD-10-CM | POA: Diagnosis not present

## 2019-11-19 DIAGNOSIS — E861 Hypovolemia: Secondary | ICD-10-CM | POA: Diagnosis not present

## 2019-11-20 DIAGNOSIS — E861 Hypovolemia: Secondary | ICD-10-CM | POA: Diagnosis not present

## 2019-11-20 DIAGNOSIS — W19XXXA Unspecified fall, initial encounter: Secondary | ICD-10-CM | POA: Diagnosis not present

## 2019-11-20 DIAGNOSIS — F0390 Unspecified dementia without behavioral disturbance: Secondary | ICD-10-CM | POA: Diagnosis not present

## 2019-11-20 DIAGNOSIS — I951 Orthostatic hypotension: Secondary | ICD-10-CM | POA: Diagnosis not present

## 2019-11-22 ENCOUNTER — Telehealth: Payer: Self-pay | Admitting: Family Medicine

## 2019-11-22 NOTE — Telephone Encounter (Signed)
I received a call from [place] with [employer]. She was wanting to talk with someone about getting patient set up with home health. I advised her that I would call patient's son and schedule a hospital follow up so they can discuss with Dr. Lorelei Pont. I left a voicemail asking Legrand Como (patient's son) to give the office a call back to schedule this.   Renee did mention that [employer] does have a free benefit right now for custodial care. Her callback number is [phone] if any questions arise.

## 2019-11-23 NOTE — Telephone Encounter (Signed)
I see Bryton is already scheduled for a hospital follow up. I attempted to return Michael's call with no answer. I left a voicemail asking him to call the office.

## 2019-11-23 NOTE — Telephone Encounter (Signed)
Pt son Legrand Como would like a call back  Best number [phone]

## 2019-11-23 NOTE — Telephone Encounter (Signed)
Can you call? [place], minimal signal.

## 2019-12-01 ENCOUNTER — Encounter: Payer: Self-pay | Admitting: Family Medicine

## 2019-12-01 ENCOUNTER — Ambulatory Visit (INDEPENDENT_AMBULATORY_CARE_PROVIDER_SITE_OTHER): Payer: PPO | Admitting: Family Medicine

## 2019-12-01 ENCOUNTER — Other Ambulatory Visit: Payer: Self-pay

## 2019-12-01 ENCOUNTER — Ambulatory Visit (INDEPENDENT_AMBULATORY_CARE_PROVIDER_SITE_OTHER)
Admission: RE | Admit: 2019-12-01 | Discharge: 2019-12-01 | Disposition: A | Discharge status: Home / Self Care | Payer: PPO | Source: Ambulatory Visit | Attending: Family Medicine | Admitting: Family Medicine

## 2019-12-01 VITALS — BP 100/60 | HR 95 | Temp 97.9°F | Ht 64.5 in | Wt 128.5 lb

## 2019-12-01 DIAGNOSIS — R7989 Other specified abnormal findings of blood chemistry: Secondary | ICD-10-CM | POA: Diagnosis not present

## 2019-12-01 DIAGNOSIS — E890 Postprocedural hypothyroidism: Secondary | ICD-10-CM

## 2019-12-01 DIAGNOSIS — R634 Abnormal weight loss: Secondary | ICD-10-CM | POA: Diagnosis not present

## 2019-12-01 DIAGNOSIS — R2689 Other abnormalities of gait and mobility: Secondary | ICD-10-CM | POA: Diagnosis not present

## 2019-12-01 DIAGNOSIS — F0280 Dementia in other diseases classified elsewhere without behavioral disturbance: Secondary | ICD-10-CM | POA: Diagnosis not present

## 2019-12-01 DIAGNOSIS — R296 Repeated falls: Secondary | ICD-10-CM | POA: Diagnosis not present

## 2019-12-01 DIAGNOSIS — E038 Other specified hypothyroidism: Secondary | ICD-10-CM | POA: Diagnosis not present

## 2019-12-01 DIAGNOSIS — Z9189 Other specified personal risk factors, not elsewhere classified: Secondary | ICD-10-CM

## 2019-12-01 MED ORDER — DONEPEZIL HCL 5 MG PO TABS: 5.0000 mg | ORAL_TABLET | Freq: Every day | ORAL | 3 refills | Status: DC

## 2019-12-01 NOTE — Patient Instructions (Signed)
STOP LISINOPRIL   REFERRALS TO SPECIALISTS, SPECIAL TESTS (MRI, CT, ULTRASOUNDS)  MAR[facility] or  [facility] will help you.   During the  Covid-19 outbreak we are not having people meet with the patient care coordinators for their protection.  They will call you when your appointment is made.   In the most extreme case (like a stroke), I will try to get them to talk to you in the office, but some insurances require paperwork and authorizations.  This will always take some time.  Specialist appointment times vary a great deal, based on their schedule / openings. -- Some specialists have very long wait times. (Example. Dermatology)

## 2019-12-01 NOTE — Progress Notes (Signed)
Kim Alberg T. Kim Capasso, MD, Kim Cole at [facility] [place], [address]  Phone: [phone]  FAX: [place] - 74 y.o. female  MRN [id number]  Date of Birth: [birth year]  Date: 12/01/2019  PCP: Owens Loffler, MD  Referral: Owens Loffler, MD  Chief Complaint  Patient presents with  . Hospitalization Follow-up    This visit occurred during the SARS-CoV-2 public health emergency.  Safety protocols were in place, including screening questions prior to the visit, additional usage of staff PPE, and extensive cleaning of exam room while observing appropriate contact time as indicated for disinfecting solutions.   Subjective:   Kim Cole is a 74 y.o. very pleasant female patient with Body mass index is 21.72 kg/m. who presents with the following:  She is a very pleasant and smart lady at baseline, and she worked for more than 40 years of [employer].  She is here in follow-up for her recent hospitalization at [facility].  I did extensive record review while she was in the office and earlier in the day prior to her face-to-face visit.  Her son Kim Cole provided additional history, and additionally had a 15-minute conversation with her other son Kim Cole on the telephone.  Hosp follow-up: I have not seen the patient in the office in 1 year  Admit date: 11/18/2019  Discharge date: 11/20/2019   While she was in the hospital in the ER she was not acting herself, and her family took her to the ER and was ultimately admitted for some confusion and failure to thrive.  Per their report they have been worried about her memory for some time, but they think that this is significantly worsened in the last year.  Particularly in the last 3 to 4 months she has taken a slide and is down for the worst.  Currently she is having 24-hour family in the home.  CT of the head  as well as abdomen and pelvis are reviewed.  She does have no evidence of acute intracranial hemorrhage, but there is some microvascular changes on her CT of the head.  Her urine culture ended up coming back negative at [facility].  [place]stein: What is the (year) (season) (date) (day) (month) 1 /5 What is the (state) (country) (town) (hospital) (floor) 5/5 Remember Apple, Umbrella, Fear - repeat them back to me 3/3 Spell W-O-R-L-D backwards?  0/5 What are the 3 objects I gave you 1/3 name pen - watch 2/2 Repeat No ifs, ands or buts? 1/1 Take a paper in your hand, fold it in half and put it on the floor.  3/3 Read and obey the following:  CLOSE YOUR EYES  1/1 Write a sentence 1/1 Copy the design 0/1  Total: 18/30  Weight loss.  She has lost approximately 40 pounds in the last several years.  After her husband died in her children are grown and out of the house, she reports that she has not been cooking and eating that much.  She also has had at least 2 falls within the last few months.  Wt Readings from Last 3 Encounters:  12/01/19 128 lb 8 oz (58.3 kg)  11/18/19 134 lb 0.6 oz (60.8 kg)  10/03/19 134 lb (60.8 kg)     Re: orthostatic hypotension BP Readings from Last 3 Encounters:  12/01/19 100/60  11/18/19 128/90  10/03/19 102/60    She is currently on lisinopril alone.  Now down to 125 lbs. Dropped almost 40-50 pounds  Check: thyroid panel F/u Urine culture Recheck CMP  Dementia - new diagnosis vs concussive head trauma. She hit head and  No ICH  Microvascular disease on CT  Weight loss Here with son, Kim Cole.  Felt 2 months ago, maybe more other times.     Check Folstein test.  [facility] AND PT and ot  Quit driving  Review of Systems is noted in the HPI, as appropriate  Objective:   BP 100/60   Pulse 95   Temp 97.9 F (36.6 C) (Temporal)   Ht 5' 4.5" (1.638 m)   Wt 128 lb 8 oz (58.3 kg)   SpO2 98%   BMI 21.72 kg/m   GEN: No acute distress;  alert,appropriate. PULM: Breathing comfortably in no respiratory distress PSYCH: Normally interactive.   Laboratory and Imaging Data:  Assessment and Plan:     ICD-10-CM   1. 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 Abnormal weight loss  R63.4 T3, free    T4, free    TSH    Basic metabolic panel    Hepatic function panel    DG Chest 2 View    Ambulatory referral to [place]  3. Abnormal TSH  R79.89 T3, free    T4, free    TSH  4. Falls frequently  R29.6 Ambulatory referral to Home Health  5. 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 Driving safety issue  Z91.89 Ambulatory referral to [place] had an approximate 75-minute encounter with the patient and her son as well as a telephone call to her other son while the patient and her son Kim Cole were in the room.  Additional time was spent on extensive chart review and review of the [facility] notes, labs, and other results.  I agree with the [facility] staff that the patient has some rapidly changing dementia.  Think that this is most likely Alzheimer's, but there is some microvascular changes on her CT.  Other forms of dementia cannot be excluded.  I really would like to have neurology's help on this case.  Head injury could play a role, but concussion evaluation is essentially impossible in a patient with baseline memory changes as well as baseline balance disorder.  Cannot be excluded.  No intracranial hemorrhage.  Hypothyroidism, check after alteration of dose.  Discussion with both of her children and the patient, and I do not think that she is safe to drive an automobile again.  All 3 agree.  Abnormal weight loss in the setting of worsening dementia.  Failure to thrive at ER visit at [facility].  As well as admission.  Home health assessment for RN  assessment, PT assessment as well as OT assessment. See home health order for additional information regarding needs.  Follow-up: No follow-ups on file.  Meds ordered this encounter  Medications  . donepezil (ARICEPT) 5 MG tablet    Sig: Take 1 tablet (5 mg total) by mouth at bedtime.    Dispense:  30 tablet    Refill:  3   Medications Discontinued During This Encounter  Medication Reason  . levothyroxine (SYNTHROID) 88 MCG tablet Patient Preference  . cholecalciferol (VITAMIN D) 1000 UNITS tablet Patient Preference  . vitamin B-12 (CYANOCOBALAMIN) 1000 MCG tablet Patient Preference  . Misc  Natural Products (GLUCOSAMINE CHONDROITIN VIT D3) CAPS Patient Preference  . Multiple Vitamins-Minerals (EYE VITAMINS) TABS Patient Preference  . vitamin E 400 UNIT capsule Patient Preference  . lisinopril (ZESTRIL) 10 MG tablet    Orders Placed This Encounter  Procedures  . DG Chest 2 View  . T3, free  . T4, free  . TSH  . Basic metabolic panel  . Hepatic function panel  . Ambulatory referral to Neurology  . Ambulatory referral to [place],  Maud Deed. Alexzander Dolinger, MD   Outpatient Encounter Medications as of 12/01/2019  Medication Sig  . ACCU-CHEK FASTCLIX LANCETS MISC Use to check blood sugar once daily.  Dx: E11.9  . Blood Glucose Monitoring Suppl (ACCU-CHEK NANO SMARTVIEW) w/Device KIT Use to check blood sugar once daily.  Dx: E11.9  . glucose blood (ACCU-CHEK SMARTVIEW) test strip Use to check blood sugar once daily.  Dx: E11.9  . levothyroxine (SYNTHROID) 75 MCG tablet Take by mouth.  . simvastatin (ZOCOR) 20 MG tablet Take 1 tablet (20 mg total) by mouth every evening.  . [DISCONTINUED] lisinopril (ZESTRIL) 10 MG tablet Held as of 10/03/19  . donepezil (ARICEPT) 5 MG tablet Take 1 tablet (5 mg total) by mouth at bedtime.  . [DISCONTINUED] cholecalciferol (VITAMIN D) 1000 UNITS tablet Take 1,000 Units by mouth daily.  . [DISCONTINUED] levothyroxine (SYNTHROID) 88 MCG  tablet Take 1 tablet by mouth daily before breakfast except for half tablet on Sundays.  Total of 6.5 tablets in a week.  . [DISCONTINUED] Misc Natural Products (GLUCOSAMINE CHONDROITIN VIT D3) CAPS Take by mouth.  . [DISCONTINUED] Multiple Vitamins-Minerals (EYE VITAMINS) TABS Take 1 tablet by mouth daily.  . [DISCONTINUED] vitamin B-12 (CYANOCOBALAMIN) 1000 MCG tablet Take 1,000 mcg by mouth daily.  . [DISCONTINUED] vitamin E 400 UNIT capsule Take 400 Units by mouth daily.   No facility-administered encounter medications on file as of 12/01/2019.

## 2019-12-02 LAB — HEPATIC FUNCTION PANEL
ALT: 20 U/L (ref 0–35)
AST: 20 U/L (ref 0–37)
Albumin: 3.6 g/dL (ref 3.5–5.2)
Alkaline Phosphatase: 69 U/L (ref 39–117)
Bilirubin, Direct: 0.1 mg/dL (ref 0.0–0.3)
Total Bilirubin: 0.5 mg/dL (ref 0.2–1.2)
Total Protein: 5.6 g/dL — ABNORMAL LOW (ref 6.0–8.3)

## 2019-12-02 LAB — T4, FREE: Free T4: 0.92 ng/dL (ref 0.60–1.60)

## 2019-12-02 LAB — TSH: TSH: 0.82 u[IU]/mL (ref 0.35–4.50)

## 2019-12-02 LAB — BASIC METABOLIC PANEL
BUN: 13 mg/dL (ref 6–23)
CO2: 28 mEq/L (ref 19–32)
Calcium: 9.4 mg/dL (ref 8.4–10.5)
Chloride: 104 mEq/L (ref 96–112)
Creatinine, Ser: 0.83 mg/dL (ref 0.40–1.20)
GFR: 67.41 mL/min (ref >60.00)
Glucose, Bld: 169 mg/dL — ABNORMAL HIGH (ref 70–99)
Potassium: 4 mEq/L (ref 3.5–5.1)
Sodium: 141 mEq/L (ref 135–145)

## 2019-12-02 LAB — T3, FREE: T3, Free: 3.3 pg/mL (ref 2.3–4.2)

## 2019-12-06 ENCOUNTER — Telehealth: Payer: Self-pay

## 2019-12-06 NOTE — Telephone Encounter (Signed)
Patient's son contacted the office and states that the patient has a lab appt scheduled for tomorrow 12/07/19. He states she just had labs on 12/01/19, and wants to see if ti is necessary for her to keep this lab appt? Please advise.

## 2019-12-06 NOTE — Telephone Encounter (Signed)
No, definitely should cancel it. I meant to tell them in the office, but it was such a complicated visit that I forgot.

## 2019-12-06 NOTE — Telephone Encounter (Signed)
Davy Pique (sister) notified by telephone that Ms. Deike does not need to come in for labs tomorrow.  Lab appointment cancelled.

## 2019-12-07 ENCOUNTER — Other Ambulatory Visit: Payer: PPO

## 2019-12-12 ENCOUNTER — Telehealth: Payer: Self-pay | Admitting: Family Medicine

## 2019-12-12 NOTE — Telephone Encounter (Signed)
Patient son called to check on status of referral for PT. Patient's son is wanting a call back to discuss. Please advise as last note  9/17.

## 2019-12-15 NOTE — Telephone Encounter (Signed)
ok 

## 2019-12-15 NOTE — Telephone Encounter (Signed)
We are having a hard time finding someone that will take her on for services right now. HH is very short staffed at this time and is having issues with placement of new patients for any services - Nursing, PT, OT, ST  Just FYI.

## 2019-12-21 ENCOUNTER — Telehealth: Payer: Self-pay | Admitting: Family Medicine

## 2019-12-21 MED ORDER — SIMVASTATIN 20 MG PO TABS
20.0000 mg | ORAL_TABLET | Freq: Every evening | ORAL | 3 refills | Status: DC
Start: 2019-12-21 — End: 2019-12-23

## 2019-12-21 NOTE — Telephone Encounter (Signed)
Please help.

## 2019-12-21 NOTE — Telephone Encounter (Signed)
Refills for simvastatin sent to [facility].

## 2019-12-21 NOTE — Telephone Encounter (Signed)
JPt's son called and says she needs refill on Simvastatin 20 mg.  He says he requested last week when he called in ref to referral (?).  She is completely out of medication.  Send refill to the mail order pharmacy on file (son did not know name).  Thank you!

## 2019-12-22 ENCOUNTER — Telehealth: Payer: Self-pay

## 2019-12-22 DIAGNOSIS — G43909 Migraine, unspecified, not intractable, without status migrainosus: Secondary | ICD-10-CM | POA: Diagnosis not present

## 2019-12-22 DIAGNOSIS — Z79899 Other long term (current) drug therapy: Secondary | ICD-10-CM | POA: Diagnosis not present

## 2019-12-22 DIAGNOSIS — F0390 Unspecified dementia without behavioral disturbance: Secondary | ICD-10-CM | POA: Diagnosis not present

## 2019-12-22 DIAGNOSIS — I10 Essential (primary) hypertension: Secondary | ICD-10-CM | POA: Diagnosis not present

## 2019-12-22 DIAGNOSIS — Z9181 History of falling: Secondary | ICD-10-CM | POA: Diagnosis not present

## 2019-12-22 DIAGNOSIS — M1990 Unspecified osteoarthritis, unspecified site: Secondary | ICD-10-CM | POA: Diagnosis not present

## 2019-12-22 DIAGNOSIS — E890 Postprocedural hypothyroidism: Secondary | ICD-10-CM | POA: Diagnosis not present

## 2019-12-22 DIAGNOSIS — J301 Allergic rhinitis due to pollen: Secondary | ICD-10-CM | POA: Diagnosis not present

## 2019-12-22 DIAGNOSIS — E785 Hyperlipidemia, unspecified: Secondary | ICD-10-CM | POA: Diagnosis not present

## 2019-12-22 DIAGNOSIS — E1169 Type 2 diabetes mellitus with other specified complication: Secondary | ICD-10-CM | POA: Diagnosis not present

## 2019-12-22 NOTE — Telephone Encounter (Signed)
Colletta Maryland OT with [facility] left v/m requesting verbal orders for [facility] social worker eval for assessment of cognition.

## 2019-12-22 NOTE — Telephone Encounter (Signed)
Call from [place], [place] with [facility].  She's requesting verbal orders for OT for pt:  1x/wk for 1 wk 2x/wk for 2 wks 1x/wk for 1 wk  Call back # [phone].

## 2019-12-22 NOTE — Telephone Encounter (Addendum)
My apologies.  I called [facility] at [phone] to try and get a phn # for Kim Cole (no last name left).  However, I got her vm when I was connected to her phn.  Lvm asking Kim Cole to call back with her correct phn #.

## 2019-12-22 NOTE — Telephone Encounter (Signed)
Tried Academic librarian with [facility].  Phone number provided is not correct.

## 2019-12-23 ENCOUNTER — Telehealth: Payer: Self-pay | Admitting: Family Medicine

## 2019-12-23 MED ORDER — SIMVASTATIN 20 MG PO TABS
20.0000 mg | ORAL_TABLET | Freq: Every evening | ORAL | 0 refills | Status: DC
Start: 2019-12-23 — End: 2021-04-23

## 2019-12-23 NOTE — Telephone Encounter (Signed)
agree

## 2019-12-23 NOTE — Telephone Encounter (Addendum)
Spoke with [employer].  She is requesting orders for speech therapy for evaluation of cognition.  Verbal orders given for speech therapy.

## 2019-12-23 NOTE — Telephone Encounter (Signed)
Verbal orders given to [facility] OT with [facility] via telephone for OT 1x/wk for 1 wk, then 2x/wk for 2 wks, then 1x/wk for 1 wk.  Colletta Maryland is also requesting orders to speech therapist.  Verbal okay given for that as well.

## 2019-12-23 NOTE — Telephone Encounter (Signed)
Colletta Maryland returning your call [phone]

## 2019-12-23 NOTE — Telephone Encounter (Signed)
Refill for 30 day supple sent to [facility] on [address] while waiting on Mail Order delivery.

## 2019-12-23 NOTE — Telephone Encounter (Signed)
Medication Refill - Medication:  simvastatin (ZOCOR) 20 MG tablet   Has the patient contacted their pharmacy? Yes advised to call office for short supply to local pharmacy as he states patient has been out for awhile.   Preferred Pharmacy (with phone number or street name):  [facility] [address] [place], [place] [address] Phone: [phone]

## 2019-12-23 NOTE — Telephone Encounter (Signed)
Stephanie's call back # is [phone].

## 2019-12-26 DIAGNOSIS — I10 Essential (primary) hypertension: Secondary | ICD-10-CM | POA: Diagnosis not present

## 2019-12-26 DIAGNOSIS — M1990 Unspecified osteoarthritis, unspecified site: Secondary | ICD-10-CM | POA: Diagnosis not present

## 2019-12-26 DIAGNOSIS — E890 Postprocedural hypothyroidism: Secondary | ICD-10-CM | POA: Diagnosis not present

## 2019-12-26 DIAGNOSIS — F0390 Unspecified dementia without behavioral disturbance: Secondary | ICD-10-CM | POA: Diagnosis not present

## 2019-12-26 DIAGNOSIS — E1169 Type 2 diabetes mellitus with other specified complication: Secondary | ICD-10-CM | POA: Diagnosis not present

## 2019-12-26 DIAGNOSIS — Z79899 Other long term (current) drug therapy: Secondary | ICD-10-CM | POA: Diagnosis not present

## 2019-12-26 DIAGNOSIS — Z9181 History of falling: Secondary | ICD-10-CM | POA: Diagnosis not present

## 2019-12-26 DIAGNOSIS — G43909 Migraine, unspecified, not intractable, without status migrainosus: Secondary | ICD-10-CM | POA: Diagnosis not present

## 2019-12-26 DIAGNOSIS — E785 Hyperlipidemia, unspecified: Secondary | ICD-10-CM | POA: Diagnosis not present

## 2019-12-26 DIAGNOSIS — J301 Allergic rhinitis due to pollen: Secondary | ICD-10-CM | POA: Diagnosis not present

## 2019-12-27 DIAGNOSIS — E890 Postprocedural hypothyroidism: Secondary | ICD-10-CM | POA: Diagnosis not present

## 2019-12-27 DIAGNOSIS — E1169 Type 2 diabetes mellitus with other specified complication: Secondary | ICD-10-CM | POA: Diagnosis not present

## 2019-12-27 DIAGNOSIS — I10 Essential (primary) hypertension: Secondary | ICD-10-CM | POA: Diagnosis not present

## 2019-12-27 DIAGNOSIS — E785 Hyperlipidemia, unspecified: Secondary | ICD-10-CM | POA: Diagnosis not present

## 2019-12-27 DIAGNOSIS — Z79899 Other long term (current) drug therapy: Secondary | ICD-10-CM | POA: Diagnosis not present

## 2019-12-27 DIAGNOSIS — G43909 Migraine, unspecified, not intractable, without status migrainosus: Secondary | ICD-10-CM | POA: Diagnosis not present

## 2019-12-27 DIAGNOSIS — Z9181 History of falling: Secondary | ICD-10-CM | POA: Diagnosis not present

## 2019-12-27 DIAGNOSIS — F0390 Unspecified dementia without behavioral disturbance: Secondary | ICD-10-CM | POA: Diagnosis not present

## 2019-12-27 DIAGNOSIS — M1990 Unspecified osteoarthritis, unspecified site: Secondary | ICD-10-CM | POA: Diagnosis not present

## 2019-12-27 DIAGNOSIS — J301 Allergic rhinitis due to pollen: Secondary | ICD-10-CM | POA: Diagnosis not present

## 2019-12-29 ENCOUNTER — Telehealth: Payer: Self-pay | Admitting: Family Medicine

## 2019-12-29 ENCOUNTER — Telehealth: Payer: Self-pay

## 2019-12-29 NOTE — Telephone Encounter (Signed)
Di Kindle given verbal orders via telephone for speech therapy 1 x every other week for 2 weeks to start on 10/121/2021.

## 2019-12-29 NOTE — Telephone Encounter (Signed)
Verbal Orders  Speech therapy for 1x every other week for 2 weeks  Orders will not start until Oct. 12th because pt son is out of town and she is staying with another family member.

## 2019-12-29 NOTE — Telephone Encounter (Signed)
agree

## 2019-12-29 NOTE — Telephone Encounter (Signed)
Noted  

## 2019-12-29 NOTE — Telephone Encounter (Signed)
Kim Cole, [facility], called.  Patient will be missing her OT visit for the week.  Patient's son is out of town.  They will do the OT next week.

## 2020-01-03 DIAGNOSIS — Z9181 History of falling: Secondary | ICD-10-CM | POA: Diagnosis not present

## 2020-01-03 DIAGNOSIS — E785 Hyperlipidemia, unspecified: Secondary | ICD-10-CM | POA: Diagnosis not present

## 2020-01-03 DIAGNOSIS — J301 Allergic rhinitis due to pollen: Secondary | ICD-10-CM | POA: Diagnosis not present

## 2020-01-03 DIAGNOSIS — M1990 Unspecified osteoarthritis, unspecified site: Secondary | ICD-10-CM | POA: Diagnosis not present

## 2020-01-03 DIAGNOSIS — E890 Postprocedural hypothyroidism: Secondary | ICD-10-CM | POA: Diagnosis not present

## 2020-01-03 DIAGNOSIS — F0390 Unspecified dementia without behavioral disturbance: Secondary | ICD-10-CM | POA: Diagnosis not present

## 2020-01-03 DIAGNOSIS — I10 Essential (primary) hypertension: Secondary | ICD-10-CM | POA: Diagnosis not present

## 2020-01-03 DIAGNOSIS — Z79899 Other long term (current) drug therapy: Secondary | ICD-10-CM | POA: Diagnosis not present

## 2020-01-03 DIAGNOSIS — G43909 Migraine, unspecified, not intractable, without status migrainosus: Secondary | ICD-10-CM | POA: Diagnosis not present

## 2020-01-03 DIAGNOSIS — E1169 Type 2 diabetes mellitus with other specified complication: Secondary | ICD-10-CM | POA: Diagnosis not present

## 2020-01-09 ENCOUNTER — Telehealth: Payer: Self-pay | Admitting: Family Medicine

## 2020-01-09 MED ORDER — LEVOTHYROXINE SODIUM 75 MCG PO TABS
75.0000 ug | ORAL_TABLET | Freq: Every day | ORAL | 3 refills | Status: DC
Start: 2020-01-09 — End: 2021-01-25

## 2020-01-09 NOTE — Telephone Encounter (Signed)
done

## 2020-01-09 NOTE — Telephone Encounter (Signed)
Pt's son, Annie Main called in requesting refill on Levothyroxine sent to [facility] in pharmacy.  Pt says she still has 8-9 days worth of meds. Son requests 90 day supply.  Thank you!

## 2020-01-11 ENCOUNTER — Telehealth: Payer: Self-pay

## 2020-01-11 NOTE — Telephone Encounter (Signed)
Received call from pharmacy to let office know they are changing manufacture of levothyroxine to [employer].

## 2020-01-26 ENCOUNTER — Encounter: Payer: Self-pay | Admitting: Family Medicine

## 2020-01-26 ENCOUNTER — Other Ambulatory Visit: Payer: Self-pay

## 2020-01-26 ENCOUNTER — Ambulatory Visit (INDEPENDENT_AMBULATORY_CARE_PROVIDER_SITE_OTHER): Payer: PPO | Admitting: Family Medicine

## 2020-01-26 VITALS — BP 120/60 | HR 76 | Temp 98.6°F | Ht 64.5 in | Wt 140.2 lb

## 2020-01-26 DIAGNOSIS — Z79899 Other long term (current) drug therapy: Secondary | ICD-10-CM | POA: Diagnosis not present

## 2020-01-26 DIAGNOSIS — M1990 Unspecified osteoarthritis, unspecified site: Secondary | ICD-10-CM | POA: Diagnosis not present

## 2020-01-26 DIAGNOSIS — Z9181 History of falling: Secondary | ICD-10-CM | POA: Diagnosis not present

## 2020-01-26 DIAGNOSIS — E785 Hyperlipidemia, unspecified: Secondary | ICD-10-CM | POA: Diagnosis not present

## 2020-01-26 DIAGNOSIS — Z23 Encounter for immunization: Secondary | ICD-10-CM

## 2020-01-26 DIAGNOSIS — E1169 Type 2 diabetes mellitus with other specified complication: Secondary | ICD-10-CM | POA: Diagnosis not present

## 2020-01-26 DIAGNOSIS — J301 Allergic rhinitis due to pollen: Secondary | ICD-10-CM | POA: Diagnosis not present

## 2020-01-26 DIAGNOSIS — E890 Postprocedural hypothyroidism: Secondary | ICD-10-CM | POA: Diagnosis not present

## 2020-01-26 DIAGNOSIS — I10 Essential (primary) hypertension: Secondary | ICD-10-CM | POA: Diagnosis not present

## 2020-01-26 DIAGNOSIS — F0390 Unspecified dementia without behavioral disturbance: Secondary | ICD-10-CM | POA: Diagnosis not present

## 2020-01-26 DIAGNOSIS — G309 Alzheimer's disease, unspecified: Secondary | ICD-10-CM

## 2020-01-26 DIAGNOSIS — F0280 Dementia in other diseases classified elsewhere without behavioral disturbance: Secondary | ICD-10-CM | POA: Diagnosis not present

## 2020-01-26 DIAGNOSIS — G43909 Migraine, unspecified, not intractable, without status migrainosus: Secondary | ICD-10-CM | POA: Diagnosis not present

## 2020-01-26 MED ORDER — DONEPEZIL HCL 10 MG PO TABS: 10.0000 mg | ORAL_TABLET | Freq: Every day | ORAL | 1 refills | Status: DC

## 2020-01-26 MED ORDER — MEMANTINE HCL 5 MG PO TABS: 5.0000 mg | ORAL_TABLET | Freq: Two times a day (BID) | ORAL | 1 refills | Status: DC

## 2020-01-26 NOTE — Progress Notes (Signed)
Margree Gimbel T. Darin Redmann, MD, [place] at [facility] [place], [address]  Phone: [phone]  FAX: [place] - 74 y.o. female  MRN [id number]  Date of Birth: 1945/07/16  Date: 01/26/2020  PCP: Owens Loffler, MD  Referral: Owens Loffler, MD  Chief Complaint  Patient presents with  . Follow-up    Dementia    This visit occurred during the SARS-CoV-2 public health emergency.  Safety protocols were in place, including screening questions prior to the visit, additional usage of staff PPE, and extensive cleaning of exam room while observing appropriate contact time as indicated for disinfecting solutions.   Subjective:   Kim Cole is a 74 y.o. very pleasant female patient with Body mass index is 23.7 kg/m. who presents with the following:  Dementia follow-up, I saw her last on 0/11/2019 after hosp follow-up.  Eating? She is being cared for by multiple family members including both sons. They have been helping with all of her meal prep, and she is doing much better in this regard.  Wt Readings from Last 3 Encounters:  01/26/20 140 lb 4 oz (63.6 kg)  12/01/19 128 lb 8 oz (58.3 kg)  11/18/19 134 lb 0.6 oz (60.8 kg)    Falls? In the interval time period, she has not had any falls.  Has had HH PT, OT: Her strength and balance have much improved.  She is on Aricept at 5 mg at night. She has been able to tolerate that without any sort of difficulty. She has not had any significant nausea.  Start namenda  Doing better, overall. Still with ongoing significant relatively rapid progressive dementia.  Review of Systems is noted in the HPI, as appropriate  Objective:   BP 120/60   Pulse 76   Temp 98.6 F (37 C) (Temporal)   Ht 5' 4.5" (1.638 m)   Wt 140 lb 4 oz (63.6 kg)   SpO2 98%   BMI 23.70 kg/m   GEN: No acute distress;  alert,appropriate. PULM: Breathing comfortably in no respiratory distress PSYCH: Normally interactive.  CV: RRR, no m/g/r   Laboratory and Imaging Data:  Assessment and Plan:     ICD-10-CM   1. 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 Need for influenza vaccination  Z23 Flu Vaccine QUAD High Dose(Fluad)  3. Alzheimer's disease ([place])  G30.9    F02.80    Ongoing dementia, Alzheimer's type. She is tolerating Aricept, so increase dosing to 10 mg, and add Namenda. She has good social support.  I talked with her son, and I do think that it is okay now for her to be alone and not require 24-hour care. She does have excellent support, and multiple family members are involved. They have been prepping her medication and food, and she is eating much better.  Her strength is much improved.  Meds ordered this encounter  Medications  . donepezil (ARICEPT) 10 MG tablet    Sig: Take 1 tablet (10 mg total) by mouth at bedtime.    Dispense:  90 tablet    Refill:  1  . memantine (NAMENDA) 5 MG tablet    Sig: Take 1 tablet (5 mg total) by mouth 2 (two) times daily.    Dispense:  180 tablet    Refill:  1   Medications Discontinued During This Encounter  Medication Reason  .  donepezil (ARICEPT) 5 MG tablet    Orders Placed This Encounter  Procedures  . Flu Vaccine QUAD High Dose(Fluad)    Follow-up: Return in about 6 months (around 07/25/2020).  Signed,  Maud Deed. Maddy Graham, MD   Outpatient Encounter Medications as of 01/26/2020  Medication Sig  . ACCU-CHEK FASTCLIX LANCETS MISC Use to check blood sugar once daily.  Dx: E11.9  . Blood Glucose Monitoring Suppl (ACCU-CHEK NANO SMARTVIEW) w/Device KIT Use to check blood sugar once daily.  Dx: E11.9  . donepezil (ARICEPT) 10 MG tablet Take 1 tablet (10 mg total) by mouth at bedtime.  [place] Kitchen glucose blood (ACCU-CHEK SMARTVIEW) test strip Use to check blood sugar once daily.  Dx: E11.9  .  levothyroxine (SYNTHROID) 75 MCG tablet Take 1 tablet (75 mcg total) by mouth daily before breakfast.  . simvastatin (ZOCOR) 20 MG tablet Take 1 tablet (20 mg total) by mouth every evening.  . [DISCONTINUED] donepezil (ARICEPT) 5 MG tablet Take 1 tablet (5 mg total) by mouth at bedtime.  . memantine (NAMENDA) 5 MG tablet Take 1 tablet (5 mg total) by mouth 2 (two) times daily.   No facility-administered encounter medications on file as of 01/26/2020.

## 2020-01-28 DIAGNOSIS — F0280 Dementia in other diseases classified elsewhere without behavioral disturbance: Secondary | ICD-10-CM | POA: Insufficient documentation

## 2020-01-28 DIAGNOSIS — G309 Alzheimer's disease, unspecified: Secondary | ICD-10-CM | POA: Insufficient documentation

## 2020-01-28 HISTORY — DX: Dementia in other diseases classified elsewhere, unspecified severity, without behavioral disturbance, psychotic disturbance, mood disturbance, and anxiety: F02.80

## 2020-02-07 ENCOUNTER — Ambulatory Visit: Payer: PPO | Admitting: Neurology

## 2020-02-07 ENCOUNTER — Telehealth: Payer: Self-pay | Admitting: Neurology

## 2020-02-07 ENCOUNTER — Encounter: Payer: Self-pay | Admitting: Neurology

## 2020-02-07 VITALS — BP 149/67 | HR 77 | Ht 64.0 in | Wt 139.5 lb

## 2020-02-07 DIAGNOSIS — R413 Other amnesia: Secondary | ICD-10-CM | POA: Insufficient documentation

## 2020-02-07 MED ORDER — MEMANTINE HCL 5 MG PO TABS
10.0000 mg | ORAL_TABLET | Freq: Two times a day (BID) | ORAL | 4 refills | Status: DC
Start: 2020-02-07 — End: 2020-02-09

## 2020-02-07 NOTE — Progress Notes (Signed)
Chief Complaint  Patient presents with  . New Patient (Initial Visit)    memory loss  . Room 4    son Kim Cole in room    HISTORICAL  Kim Cole is a 74 year old female, seen in request by her primary care physician Dr. Owens Cole, for evaluation of memory loss, she is accompanied by her son Kim Cole at today's clinical visit February 07, 2020.  I reviewed and summarized the referring note. PMHx. Hyperlipidemia Hypothyrodism Right hip replacement.  She retired from [employer] at age 58, prior to retirement, she was Web designer, did have family history of memory loss, her mother suffered dementia in her 79s  She now lives at home, was noted to have gradual onset of memory loss over the past couple years, especially since 2020, she has difficulty keeping up her pills, quit driving at the beginning of 2021, she reported sleeping well, has good appetite, she had left hip pain, complains of gait abnormality.  REVIEW OF SYSTEMS: Full 14 system review of systems performed and notable only for as above All other review of systems were negative.  ALLERGIES: No Known Allergies  HOME MEDICATIONS: Current Outpatient Medications  Medication Sig Dispense Refill  . donepezil (ARICEPT) 10 MG tablet Take 1 tablet (10 mg total) by mouth at bedtime. 90 tablet 1  . levothyroxine (SYNTHROID) 75 MCG tablet Take 1 tablet (75 mcg total) by mouth daily before breakfast. 90 tablet 3  . memantine (NAMENDA) 5 MG tablet Take 1 tablet (5 mg total) by mouth 2 (two) times daily. 180 tablet 1  . simvastatin (ZOCOR) 20 MG tablet Take 1 tablet (20 mg total) by mouth every evening. 30 tablet 0  . ACCU-CHEK FASTCLIX LANCETS MISC Use to check blood sugar once daily.  Dx: E11.9 102 each 3  . Blood Glucose Monitoring Suppl (ACCU-CHEK NANO SMARTVIEW) w/Device KIT Use to check blood sugar once daily.  Dx: E11.9 1 kit 0  . glucose blood (ACCU-CHEK SMARTVIEW) test strip Use to check blood  sugar once daily.  Dx: E11.9 100 each 3   No current facility-administered medications for this visit.    PAST MEDICAL HISTORY: Past Medical History:  Diagnosis Date  . Allergic rhinitis due to pollen   . Anemia   . Arthritis   . Diabetes mellitus type 2 in obese ([place]) 11/17/2013  . Hyperlipidemia   . Hypertension   . Hypothyroid   . Migraine    NONE RECENT    PAST SURGICAL HISTORY: Past Surgical History:  Procedure Laterality Date  . COLONOSCOPY WITH PROPOFOL N/A 09/10/2015   Procedure: COLONOSCOPY WITH PROPOFOL;  Surgeon: Kim Fair, MD;  Location: [facility];  Service: Endoscopy;  Laterality: N/A;  . COLONSCOPY  FEW YRS AGO   2 SMALL POLYPS  . TOTAL HIP ARTHROPLASTY Right 2011   Dr. Wynelle Cole  . TUBAL LIGATION      FAMILY HISTORY: Family History  Problem Relation Age of Onset  . Diabetes Mother   . Diabetes Father     SOCIAL HISTORY: Social History   Socioeconomic History  . Marital status: Widowed    Spouse name: Not on file  . Number of children: Not on file  . Years of education: Not on file  . Highest education level: Not on file  Occupational History  . Occupation: retired    Fish farm manager: [employer]  Tobacco Use  . Smoking status: Never Smoker  . Smokeless tobacco: Never Used  Vaping Use  . Vaping Use:  Never used  Substance and Sexual Activity  . Alcohol use: Not Currently    Comment: SELDOM  . Drug use: No  . Sexual activity: Not Currently  Other Topics Concern  . Not on file  Social History Narrative   Worked 51 years for [employer] / [employer]   Married   Children and Kim Cole's mother   Right Handed   Drinks a soda seldomly   Social Determinants of Health   Financial Resource Strain:   . Difficulty of Paying Living Expenses: Not on file  Food Insecurity:   . Worried About Charity fundraiser in the Last Year: Not on file  . Ran Out of Food in the Last Year: Not on  file  Transportation Needs:   . Lack of Transportation (Medical): Not on file  . Lack of Transportation (Non-Medical): Not on file  Physical Activity:   . Days of Exercise per Week: Not on file  . Minutes of Exercise per Session: Not on file  Stress:   . Feeling of Stress : Not on file  Social Connections:   . Frequency of Communication with Friends and Family: Not on file  . Frequency of Social Gatherings with Friends and Family: Not on file  . Attends Religious Services: Not on file  . Active Member of Clubs or Organizations: Not on file  . Attends Archivist Meetings: Not on file  . Marital Status: Not on file  Intimate Partner Violence:   . Fear of Current or Ex-Partner: Not on file  . Emotionally Abused: Not on file  . Physically Abused: Not on file  . Sexually Abused: Not on file     PHYSICAL EXAM   Vitals:   02/07/20 0828  BP: (!) 149/67  Pulse: 77  Weight: 139 lb 8 oz (63.3 kg)  Height: _0  (1.626 m)   Not recorded     Body mass index is 23.95 kg/m.  PHYSICAL EXAMNIATION:  Gen: NAD, conversant, well nourised, well groomed                     Cardiovascular: Regular rate rhythm, no peripheral edema, warm, nontender. Eyes: Conjunctivae clear without exudates or hemorrhage Neck: Supple, no carotid bruits. Pulmonary: Clear to auscultation bilaterally   NEUROLOGICAL EXAM:  MENTAL STATUS: MMSE - Mini Mental State Exam 02/07/2020 11/05/2018 07/22/2017  Orientation to time _1 Orientation to Place _2 Registration _3 Attention/ Calculation 0 5 0  Recall _4 Recall-comments - forgot 2 unable to recall 2 of 3 words  Language- name 2 objects 2 0 0  Language- repeat _5 Language- follow 3 step command 3 0 3  Language- read & follow direction 1 0 0  Write a sentence 1 0 0  Copy design 1 0 0  Total score _6 CRANIAL NERVES: CN II: Visual fields are full to confrontation. Pupils are round equal and briskly reactive to  light. CN III, IV, VI: extraocular movement are normal. No ptosis. CN V: Facial sensation is intact to light touch CN VII: Face is symmetric with normal eye closure  CN VIII: Hearing is normal to causal conversation. CN IX, X: Phonation is normal. CN XI: Head turning and shoulder shrug are intact  MOTOR: There is no pronator drift of out-stretched arms. Muscle bulk and tone are normal. Muscle strength is  normal.  REFLEXES: Reflexes are 2+ and symmetric at the biceps, triceps, knees, and ankles. Plantar responses are flexor.  SENSORY: Intact to light touch, pinprick and vibratory sensation are intact in fingers and toes.  COORDINATION: There is no trunk or limb dysmetria noted.  GAIT/STANCE: Need to push-up to get up from seated position, antalgic, cautious  DIAGNOSTIC DATA (LABS, IMAGING, TESTING) - I reviewed patient records, labs, notes, testing and imaging myself where available.   ASSESSMENT AND PLAN  GENORA ARP is a 74 y.o. 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 Ph.D.  [facility] [address], [place], [place] [address] Ph: [phone] Fax: [phone]  CC:  Kim Cole, [place],  [place] [address]

## 2020-02-07 NOTE — Telephone Encounter (Signed)
health team order sent to GI. No auth they will reach out to the patient to schedule.  

## 2020-02-08 ENCOUNTER — Telehealth: Payer: Self-pay | Admitting: Neurology

## 2020-02-08 LAB — RPR: RPR Ser Ql: NONREACTIVE

## 2020-02-08 LAB — ANA W/REFLEX IF POSITIVE: Anti Nuclear Antibody (ANA): NEGATIVE

## 2020-02-08 LAB — C-REACTIVE PROTEIN: CRP: <1 mg/L (ref 0–10)

## 2020-02-08 LAB — SEDIMENTATION RATE: Sed Rate: 4 mm/hr (ref 0–40)

## 2020-02-08 LAB — VITAMIN B12: Vitamin B-12: 123 pg/mL — ABNORMAL LOW (ref 232–1245)

## 2020-02-08 NOTE — Telephone Encounter (Signed)
Melissa from [facility] requests to speak with nurse regarding memantine (NAMENDA) 5 MG tablet dosage & instructions for pt.  Ref. # [id number]

## 2020-02-09 ENCOUNTER — Telehealth: Payer: Self-pay | Admitting: Neurology

## 2020-02-09 DIAGNOSIS — E538 Deficiency of other specified B group vitamins: Secondary | ICD-10-CM

## 2020-02-09 DIAGNOSIS — R413 Other amnesia: Secondary | ICD-10-CM

## 2020-02-09 MED ORDER — MEMANTINE HCL 10 MG PO TABS
10.0000 mg | ORAL_TABLET | Freq: Two times a day (BID) | ORAL | 4 refills | Status: DC
Start: 2020-02-09 — End: 2021-04-23

## 2020-02-09 NOTE — Telephone Encounter (Signed)
Left message for her son, Annie Main (on [place]), requesting a return call.

## 2020-02-09 NOTE — Telephone Encounter (Signed)
Please call patient, laboratory evaluation showed significantly decreased vitamin B12 level, 123, she would benefit B12 IM supplement, this can be done in our office, let her primary care's office  Rest of the laboratory evaluation showed no significant abnormalities.

## 2020-02-09 NOTE — Telephone Encounter (Signed)
I spoke to the patient's son, Kim Cole (on [place]), and provided him with the lab results. He verbalized understanding and plans to contact her PCP office to follow up for treatment of her B12 deficiency.  Dr. Rhea Belton routine recommendation is getting B12 injections by the following schedule:  1038mcg daily x 1 week 1044mcg weekly x 1 month 1051mcg monthly x 1 year  However, this schedule can vary among providers and he will be instructed by her PCP office.  This message and her lab results will be faxed to her PCP office, attention Dr. Konrad Penta (fax: [phone]).  He appreciated the call.

## 2020-02-10 ENCOUNTER — Telehealth: Payer: Self-pay | Admitting: *Deleted

## 2020-02-10 NOTE — Telephone Encounter (Signed)
Received fax from [facility] with lab results and a message that patient would be calling to schedule office visit to have her Vitamin B12 deficiency treated.  Per Dr. Lorelei Pont, patient does not need to schedule an appointment.  He ask that I call Annie Main (son) and let him know to start Kim Cole on OTC Vitamin B12 1000 mcg daily.  Left message for Annie Main as instructed.  I ask that he call us back if he has any questions.  Medication list updated.

## 2020-02-11 ENCOUNTER — Other Ambulatory Visit: Payer: PPO

## 2020-02-13 NOTE — Telephone Encounter (Signed)
[facility], spoke with Ysidro Evert, asked to check on Ref. # [id number]. He stated they received clarification, med has been shipped to patient.

## 2020-02-15 DIAGNOSIS — F0390 Unspecified dementia without behavioral disturbance: Secondary | ICD-10-CM | POA: Diagnosis not present

## 2020-02-15 DIAGNOSIS — G43909 Migraine, unspecified, not intractable, without status migrainosus: Secondary | ICD-10-CM | POA: Diagnosis not present

## 2020-02-15 DIAGNOSIS — E1169 Type 2 diabetes mellitus with other specified complication: Secondary | ICD-10-CM | POA: Diagnosis not present

## 2020-02-15 DIAGNOSIS — E890 Postprocedural hypothyroidism: Secondary | ICD-10-CM | POA: Diagnosis not present

## 2020-02-15 DIAGNOSIS — I10 Essential (primary) hypertension: Secondary | ICD-10-CM | POA: Diagnosis not present

## 2020-02-15 DIAGNOSIS — E785 Hyperlipidemia, unspecified: Secondary | ICD-10-CM | POA: Diagnosis not present

## 2020-02-15 DIAGNOSIS — Z9181 History of falling: Secondary | ICD-10-CM | POA: Diagnosis not present

## 2020-02-15 DIAGNOSIS — Z79899 Other long term (current) drug therapy: Secondary | ICD-10-CM | POA: Diagnosis not present

## 2020-02-15 DIAGNOSIS — J301 Allergic rhinitis due to pollen: Secondary | ICD-10-CM | POA: Diagnosis not present

## 2020-02-15 DIAGNOSIS — M1990 Unspecified osteoarthritis, unspecified site: Secondary | ICD-10-CM | POA: Diagnosis not present

## 2020-02-20 ENCOUNTER — Telehealth: Payer: Self-pay | Admitting: Family Medicine

## 2020-02-20 NOTE — Telephone Encounter (Signed)
Son is calling to see if we received results from Neurologist (results involving B12 injections) His name is Annie Main please call him back at [phone]. EM

## 2020-02-20 NOTE — Telephone Encounter (Signed)
Spoke with Kim Cole and advised him that we did receive the fax from [facility] with lab results and a message that patient would be calling to schedule office visit to have her Vitamin B12 deficiency treated.  Per Dr. Lorelei Pont, patient does not need to schedule an appointment.  She just needs to  start OTC Vitamin B12 1000 mcg daily.  Kim Cole states understanding.

## 2020-03-03 ENCOUNTER — Other Ambulatory Visit: Payer: Self-pay

## 2020-03-03 ENCOUNTER — Ambulatory Visit
Admission: RE | Admit: 2020-03-03 | Discharge: 2020-03-03 | Disposition: A | Discharge status: Home / Self Care | Payer: PPO | Source: Ambulatory Visit | Attending: Neurology | Admitting: Neurology

## 2020-03-03 DIAGNOSIS — R413 Other amnesia: Secondary | ICD-10-CM

## 2020-03-05 ENCOUNTER — Telehealth: Payer: Self-pay | Admitting: Neurology

## 2020-03-05 NOTE — Telephone Encounter (Signed)
I spoke to her son, Annie Main (on [place]), and provided him with the MRI results below. He will relay them to his mother.

## 2020-03-05 NOTE — Telephone Encounter (Signed)
   IMPRESSION: This MRI of the brain without contrast shows the following: 1.  Mild to moderate generalized cortical atrophy. 2.  Brain parenchyma signal is normal for age. 3.  No acute findings.   Please call patient, there are age-related changes on MRI of the brain, there was no acute abnormality.

## 2020-04-30 ENCOUNTER — Ambulatory Visit: Payer: PPO | Admitting: Family Medicine

## 2020-04-30 DIAGNOSIS — Z0289 Encounter for other administrative examinations: Secondary | ICD-10-CM

## 2020-04-30 NOTE — Progress Notes (Deleted)
    Kim Broder T. Vance Hochmuth, MD, [employer] [facility] [place], [address]  Phone: [phone]  FAX: [place] - 75 y.o. female  MRN [id number]  Date of Birth: [birth year]  Date: 04/30/2020  PCP: Owens Loffler, MD  Referral: Owens Loffler, MD  No chief complaint on file.   This visit occurred during the SARS-CoV-2 public health emergency.  Safety protocols were in place, including screening questions prior to the visit, additional usage of staff PPE, and extensive cleaning of exam room while observing appropriate contact time as indicated for disinfecting solutions.   Subjective:   Kim Cole is a 75 y.o. very pleasant female patient with There is no height or weight on file to calculate BMI. who presents with the following:  Patient who has some dementia who has been dizzy spells off and on.    Review of Systems is noted in the HPI, as appropriate  Objective:   There were no vitals taken for this visit.  GEN: No acute distress; alert,appropriate. PULM: Breathing comfortably in no respiratory distress PSYCH: Normally interactive.   Laboratory and Imaging Data:  Assessment and Plan:   ***

## 2020-08-06 ENCOUNTER — Encounter: Payer: Self-pay | Admitting: Neurology

## 2020-08-06 ENCOUNTER — Ambulatory Visit: Payer: PPO | Admitting: Neurology

## 2020-08-06 VITALS — BP 111/66 | HR 77 | Ht 64.0 in | Wt 136.0 lb

## 2020-08-06 DIAGNOSIS — F0280 Dementia in other diseases classified elsewhere without behavioral disturbance: Secondary | ICD-10-CM | POA: Diagnosis not present

## 2020-08-06 DIAGNOSIS — R413 Other amnesia: Secondary | ICD-10-CM

## 2020-08-06 DIAGNOSIS — G309 Alzheimer's disease, unspecified: Secondary | ICD-10-CM

## 2020-08-06 NOTE — Progress Notes (Signed)
Chief Complaint  Patient presents with  . New Patient (Initial Visit)    memory loss  . Room 4    son Kim Cole in room    HISTORICAL  Kim Cole is a 76 year old female, seen in request by her primary care physician Dr. Owens Loffler, for evaluation of memory loss, she is accompanied by her son Kim Cole at today's clinical visit February 07, 2020.  I reviewed and summarized the referring note. PMHx. Hyperlipidemia Hypothyrodism Right hip replacement.  She retired from [employer] at age 40, prior to retirement, she was Web designer, did have family history of memory loss, her mother suffered dementia in her 75s  She now lives at home, was noted to have gradual onset of memory loss over the past couple years, especially since 2020, she has difficulty keeping up her pills, quit driving at the beginning of 2021, she reported sleeping well, has good appetite, she had left hip pain, complains of gait abnormality.  Update Aug 06, 2020 SS: Here today with her sister Kim Cole, she is living alone. She thinks memory is doing fine. Isn't driving, her kids took her keys away.   Laboratory evaluation in November 2021 (RPR, B12, ANA, CRP, sed rate) shows significantly decreased B12 123. Reports PCP gave pills OTC, doesn't think rechecked.  MRI of the brain in December 2021 showed age-related changes, no acute abnormality.   Remains on Aricept 10 mg daily, Namenda 10 mg twice daily. Her son fixes a pill box, she takes them. Had to call her son to verify her meds.  Her sister mentions issues are mild, some word findings issues. Has short term memory issues. I noticed resting tremor to right hand at times, doesn't affect handwriting. She cooks, manages her household, she works with son to manage financial affairs. Likes to watch TV, sews. No falls, uses cane.   REVIEW OF SYSTEMS: Full 14 system review of systems performed and notable only for as above  See HPI.    ALLERGIES: No Known Allergies  HOME MEDICATIONS: Current Outpatient Medications  Medication Sig Dispense Refill  . ACCU-CHEK FASTCLIX LANCETS MISC Use to check blood sugar once daily.  Dx: E11.9 102 each 3  . levothyroxine (SYNTHROID) 75 MCG tablet Take 1 tablet (75 mcg total) by mouth daily before breakfast. 90 tablet 3  . memantine (NAMENDA) 10 MG tablet Take 1 tablet (10 mg total) by mouth 2 (two) times daily. 180 tablet 4  . simvastatin (ZOCOR) 20 MG tablet Take 1 tablet (20 mg total) by mouth every evening. 30 tablet 0  . vitamin B-12 (CYANOCOBALAMIN) 1000 MCG tablet Take 1,000 mcg by mouth daily.     No current facility-administered medications for this visit.    PAST MEDICAL HISTORY: Past Medical History:  Diagnosis Date  . Allergic rhinitis due to pollen   . Anemia   . Arthritis   . Diabetes mellitus type 2 in obese ([place]) 11/17/2013  . Hyperlipidemia   . Hypertension   . Hypothyroid   . Migraine    NONE RECENT    PAST SURGICAL HISTORY: Past Surgical History:  Procedure Laterality Date  . COLONOSCOPY WITH PROPOFOL N/A 09/10/2015   Procedure: COLONOSCOPY WITH PROPOFOL;  Surgeon: Garlan Fair, MD;  Location: [facility];  Service: Endoscopy;  Laterality: N/A;  . COLONSCOPY  FEW YRS AGO   2 SMALL POLYPS  . TOTAL HIP ARTHROPLASTY Right 2011   Dr. Wynelle Link  . TUBAL LIGATION      FAMILY HISTORY: Family  History  Problem Relation Age of Onset  . Diabetes Mother   . Diabetes Father     SOCIAL HISTORY: Social History   Socioeconomic History  . Marital status: Widowed    Spouse name: Not on file  . Number of children: Not on file  . Years of education: Not on file  . Highest education level: Not on file  Occupational History  . Occupation: retired    Fish farm manager: [employer]  Tobacco Use  . Smoking status: Never Smoker  . Smokeless tobacco: Never Used  Vaping Use  . Vaping Use: Never used  Substance and Sexual Activity  . Alcohol use: Not  Currently    Comment: SELDOM  . Drug use: No  . Sexual activity: Not Currently  Other Topics Concern  . Not on file  Social History Narrative   Worked 67 years for [employer] / [employer]   Married   Children and Glynda Jaeger Wavra's mother   Right Handed   Drinks a soda seldomly   Social Determinants of Health   Financial Resource Strain: Not on file  Food Insecurity: Not on file  Transportation Needs: Not on file  Physical Activity: Not on file  Stress: Not on file  Social Connections: Not on file  Intimate Partner Violence: Not on file     PHYSICAL EXAM   Vitals:   08/06/20 1322  BP: 111/66  Pulse: 77  Weight: 136 lb (61.7 kg)  Height: 5\' 4"  (1.626 m)   Not recorded     Body mass index is 23.34 kg/m.  PHYSICAL EXAMNIATION:  Gen: NAD, conversant, well nourised, well groomed                     Cardiovascular: Regular rate rhythm, no peripheral edema, warm, nontender. Eyes: Conjunctivae clear without exudates or hemorrhage Pulmonary: Clear to auscultation bilaterally   NEUROLOGICAL EXAM:  MENTAL STATUS: MMSE - Mini Mental State Exam 08/06/2020 02/07/2020 11/05/2018  Orientation to time 1 1 5   Orientation to Place 5 3 5   Registration 3 3 3   Attention/ Calculation 0 0 5  Recall 1 1 1   Recall-comments - - forgot 2  Language- name 2 objects 1 2 0  Language- repeat 1 1 1   Language- follow 3 step command 3 3 0  Language- read & follow direction 1 1 0  Write a sentence 1 1 0  Copy design 1 1 0  Total score 18 17 20      CRANIAL NERVES: CN II: Visual fields are full to confrontation. Pupils are round equal and briskly reactive to light. CN III, IV, VI: extraocular movement are normal. No ptosis. CN V: Facial sensation is intact to light touch CN VII: Face is symmetric with normal eye closure  CN VIII: Hearing is normal to causal conversation. CN IX, X: Phonation is normal. CN XI: Head turning and shoulder shrug  are intact  MOTOR: Good strength all extremities, mild left hip flexion weakness  REFLEXES: Reflexes are 2+ throughout.  SENSORY: Intact to light touch  COORDINATION: Finger-nose-finger and heel-to-shin is normal bilaterally. Mild rest hand resting tremor noted.  GAIT/STANCE: Need to push-up to get up from seated position, limp on left  DIAGNOSTIC DATA (LABS, IMAGING, TESTING) - I reviewed patient records, labs, notes, testing and imaging myself where available.   ASSESSMENT AND PLAN  Kim Cole is a 75 y.o. female    1. 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  This included previsit chart review, lab review, study review, order entry, electronic health record documentation, patient education, performing MMSE.  Evangeline Dakin, DNP  [facility] [address] [place], [place] [address] [phone]

## 2020-08-06 NOTE — Patient Instructions (Addendum)
Continue the Aricept and Namenda Check B12 level  Continue to see your primary care See you back in 6 months

## 2020-08-07 LAB — VITAMIN B12: Vitamin B-12: 1566 pg/mL — ABNORMAL HIGH (ref 232–1245)

## 2020-08-08 ENCOUNTER — Telehealth: Payer: Self-pay

## 2020-08-08 NOTE — Telephone Encounter (Signed)
Left detailed message for son to call back

## 2020-08-08 NOTE — Telephone Encounter (Signed)
-----   Message from Suzzanne Cloud, NP sent at 08/07/2020  5:41 AM EDT ----- She is indeed on supplement, this should be followed by primary care. Needs to clarify how much she is taking with her son and discuss with PCP. Please fax over PCP.

## 2020-10-02 ENCOUNTER — Other Ambulatory Visit: Payer: Self-pay | Admitting: Family Medicine

## 2020-10-13 IMAGING — DX DG CHEST 2V
2 series · 2 of 2 positions shown · non-contrast
Comparison: 10/05/2009

CLINICAL DATA: 40 pound weight loss

EXAM:
CHEST - 2 VIEW

[chest pa]
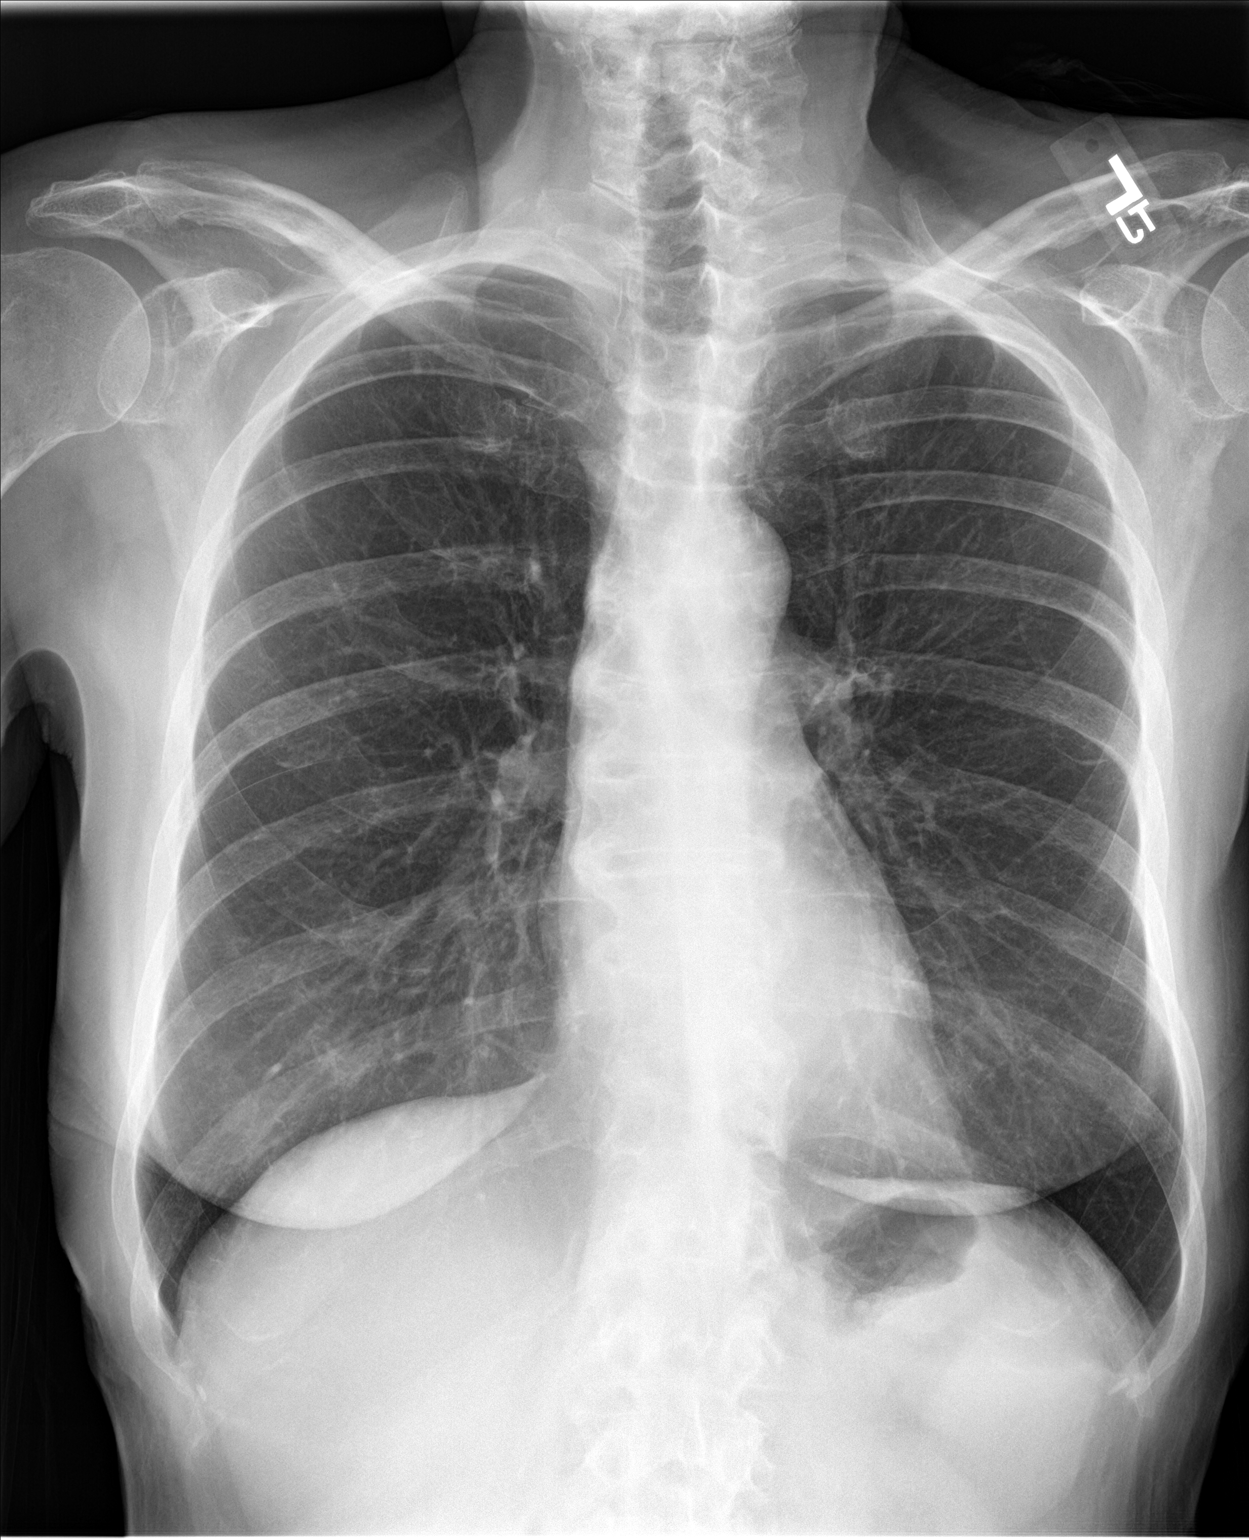

[chest lat]
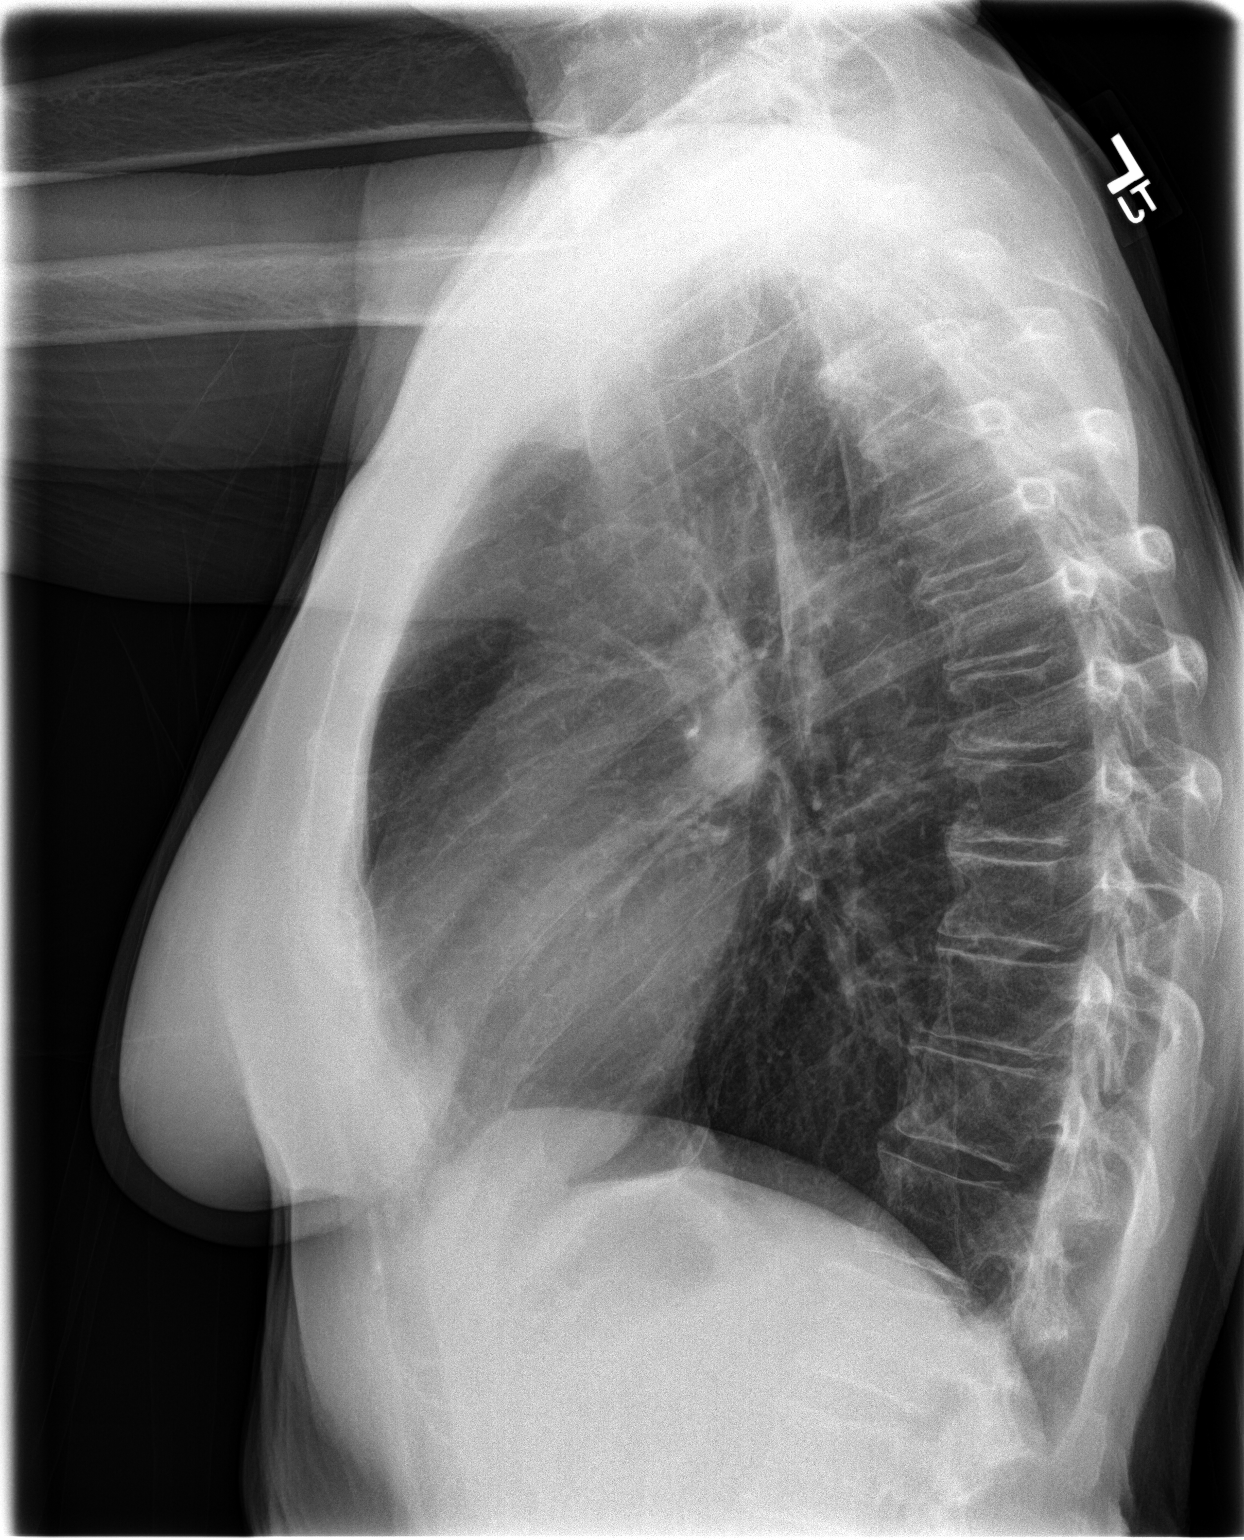

[2 of 2 positions shown; findings below may reference images not displayed]

FINDINGS: Cardiac shadow is within normal limits. The lungs are well aerated
bilaterally. No focal infiltrate or sizable effusion is seen.
Degenerative changes of the thoracic spine are noted.
IMPRESSION: No acute abnormality seen.

## 2020-10-19 ENCOUNTER — Telehealth: Payer: Self-pay | Admitting: Family Medicine

## 2020-10-19 MED ORDER — DONEPEZIL HCL 10 MG PO TBDP: 10.0000 mg | ORAL_TABLET | Freq: Every day | ORAL | 0 refills | Status: DC

## 2020-10-19 NOTE — Telephone Encounter (Signed)
Refill sent as requested. 

## 2020-10-19 NOTE — Addendum Note (Signed)
Addended by: Carter Kitten on: 10/19/2020 12:59 PM   Modules accepted: Orders

## 2020-10-19 NOTE — Telephone Encounter (Signed)
Mrs. Donlan son Barnabas Lister) called in and stated that they have been waiting a week for this medication and havent heard anything from it.    Encourage patient to contact the pharmacy for refills or they can request refills through [facility]:  Please schedule appointment if longer than 1 year  NEXT APPOINTMENT DATE:  MEDICATION: donepezil (ARICEPT ODT) 10 MG disintegrating tablet  Is the patient out of medication? yes  PHARMACY:  [facility]  Let patient know to contact pharmacy at the end of the day to make sure medication is ready.  Please notify patient to allow 48-72 hours to process  CLINICAL FILLS OUT ALL BELOW:   LAST REFILL:  QTY:  REFILL DATE:    OTHER COMMENTS:    Okay for refill?  Please advise

## 2020-10-23 ENCOUNTER — Telehealth: Payer: Self-pay | Admitting: *Deleted

## 2020-10-23 MED ORDER — DONEPEZIL HCL 10 MG PO TABS: 10.0000 mg | ORAL_TABLET | Freq: Every day | ORAL | 0 refills | Status: DC

## 2020-10-23 NOTE — Telephone Encounter (Signed)
Aricept ODT was added by the neurology office.  I spoke with son and he states Ms. Askins has no trouble swallowing pills.  Spoke with [name] and advised them to cancel Aricept ODT prescription and I sent in a new prescription for regular tablets.

## 2020-10-23 NOTE — Telephone Encounter (Signed)
Missy with [facility] left a voicemail stating that they received a new script for patient's Donepezil 10 g . Missy stated that the refill came in as the ODT form and not the tablet that you swallow. Missy stated in the past the patient has gotten the pill that you swallow and not the ODT. Missy requested a call back to make sure the change to ODT is correct. Ref# [id number]

## 2020-10-23 NOTE — Addendum Note (Signed)
Addended by: Carter Kitten on: 10/23/2020 11:35 AM   Modules accepted: Orders

## 2020-12-25 ENCOUNTER — Other Ambulatory Visit: Payer: Self-pay

## 2020-12-25 NOTE — Telephone Encounter (Signed)
LOV 01/26/20 no future appointments. Patient sees neurologist. Does this request need to go to neurology office?  Last refilled on 10/23/2020 #90 with 0 refill.

## 2020-12-26 MED ORDER — DONEPEZIL HCL 10 MG PO TABS: 10.0000 mg | ORAL_TABLET | Freq: Every day | ORAL | 3 refills | Status: DC

## 2021-01-25 ENCOUNTER — Other Ambulatory Visit: Payer: Self-pay | Admitting: Family Medicine

## 2021-01-25 NOTE — Telephone Encounter (Signed)
Last office visit 01/26/2020 for Dementia.  Last refilled 01/09/2020 for #90 with 3 refills.  Last Thyroid labs 12/01/2019.  No future appointments with PCP.  Refill?

## 2021-02-04 ENCOUNTER — Telehealth: Payer: Self-pay | Admitting: Family Medicine

## 2021-02-04 NOTE — Telephone Encounter (Signed)
LVM for pt to rtn my call to schedule AWV with [facility].  

## 2021-02-20 ENCOUNTER — Ambulatory Visit: Payer: PPO | Admitting: Neurology

## 2021-03-21 ENCOUNTER — Ambulatory Visit: Payer: PPO | Admitting: Neurology

## 2021-04-23 ENCOUNTER — Other Ambulatory Visit: Payer: Self-pay | Admitting: Neurology

## 2021-04-23 ENCOUNTER — Other Ambulatory Visit: Payer: Self-pay | Admitting: Family Medicine

## 2021-04-23 NOTE — Telephone Encounter (Signed)
Last office visit 01/26/2020 for Dementia.  Last refilled 12/23/2019 for #30 with no refills.  Last Lipid Panel 11/10/2018.  No future appointments.

## 2021-04-23 NOTE — Telephone Encounter (Signed)
Please call son and schedule CPE with fasting labs prior.  Needs to be seen in order to keep getting refills on her medications.

## 2021-04-23 NOTE — Telephone Encounter (Signed)
Rx refilled.

## 2021-04-23 NOTE — Telephone Encounter (Signed)
We can send in #90, 0 ref  But they really need to have an appointment.  She has not been seen in our office since 2021, and it is not safe with her health and age.

## 2021-04-23 NOTE — Telephone Encounter (Signed)
Called son Kim Cole to get Kim Cole scheduled for CPE. Lvmtcb to do so

## 2021-05-09 ENCOUNTER — Ambulatory Visit: Payer: PPO | Admitting: Neurology

## 2021-06-03 ENCOUNTER — Ambulatory Visit: Payer: PPO | Admitting: Neurology

## 2021-06-10 ENCOUNTER — Ambulatory Visit: Payer: PPO | Admitting: Neurology

## 2021-06-10 ENCOUNTER — Other Ambulatory Visit: Payer: Self-pay

## 2021-06-10 ENCOUNTER — Encounter: Payer: Self-pay | Admitting: Neurology

## 2021-06-10 VITALS — BP 145/68 | HR 60 | Ht 64.0 in | Wt 147.5 lb

## 2021-06-10 DIAGNOSIS — R269 Unspecified abnormalities of gait and mobility: Secondary | ICD-10-CM | POA: Insufficient documentation

## 2021-06-10 DIAGNOSIS — G309 Alzheimer's disease, unspecified: Secondary | ICD-10-CM

## 2021-06-10 DIAGNOSIS — M25552 Pain in left hip: Secondary | ICD-10-CM | POA: Insufficient documentation

## 2021-06-10 DIAGNOSIS — F0280 Dementia in other diseases classified elsewhere without behavioral disturbance: Secondary | ICD-10-CM | POA: Diagnosis not present

## 2021-06-10 MED ORDER — DONEPEZIL HCL 10 MG PO TABS: 10.0000 mg | ORAL_TABLET | Freq: Every day | ORAL | 3 refills | Status: DC

## 2021-06-10 MED ORDER — MEMANTINE HCL 10 MG PO TABS: 10.0000 mg | ORAL_TABLET | Freq: Two times a day (BID) | ORAL | 3 refills | Status: DC

## 2021-06-10 NOTE — Progress Notes (Signed)
? ? ?HISTORICAL ? ?Kim Cole is a 76 year old female, seen in request by her primary care physician Dr. Owens Cole, for evaluation of memory loss, she is accompanied by her son Kim Cole at today's clinical visit February 07, 2020. ? ?I reviewed and summarized the referring note. PMHx. ?Hyperlipidemia ?Hypothyrodism ?Right hip replacement. ? 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 ?She now lives at home, was noted to have gradual onset of memory loss over the past couple years, especially since 2020, she has difficulty keeping up her pills, quit driving at the beginning of 2021, she reported sleeping well, has good appetite, she had left hip pain, complains of gait abnormality. ? ?UPDATE June 10 2021: ?She is with her daughter in law at today's visit, overall doing well, worsening memory loss, MoCA examination 9/30, she enjoys watch TV, has good appetite, left hip pain, has difficulty walking ? ?I personally reviewed MRI of the brain in December 2021, mild atrophy no acute abnormality ? ?Laboratory evaluation previously showed B12 deficiency, level was only 123, after supplement, repeat B12 was elevated, otherwise normal CMP, ESR, C-reactive protein, negative RPR ? ?She has good social support son visit her almost daily, so is her sister, gait abnormality is mainly due to left hip pain, she was noted to have gradual worsening right hand tremor, but denies difficulty sleeping, no hallucinations ? ?REVIEW OF SYSTEMS: Full 14 system review of systems performed and notable only for as above ? ?See HPI.  ? ?ALLERGIES: ?Not on File ? ?HOME MEDICATIONS: ?Current Outpatient Medications  ?Medication Sig Dispense Refill  ? ACCU-CHEK FASTCLIX LANCETS MISC Use to check blood sugar once daily.  Dx: E11.9 102 each 3  ? donepezil (ARICEPT) 10 MG tablet Take 1 tablet (10 mg  total) by mouth at bedtime. 90 tablet 3  ? levothyroxine (SYNTHROID) 75 MCG tablet Take 1 tablet by mouth daily before breakfast (dose change- deactivated 31mg) 90 tablet 2  ? memantine (NAMENDA) 10 MG tablet Take 1 tablet by mouth twice a day (new dose - deactivated 567m 180 tablet 0  ? simvastatin (ZOCOR) 20 MG tablet Take 1 tablet by mouth every evening 90 tablet 0  ? vitamin B-12 (CYANOCOBALAMIN) 1000 MCG tablet Take 1,000 mcg by mouth daily.    ? ?No current facility-administered medications for this visit.  ? ? ?PAST MEDICAL HISTORY: ?Past Medical History:  ?Diagnosis Date  ? Allergic rhinitis due to pollen   ? Anemia   ? Arthritis   ? Diabetes mellitus type 2 in obese ([facility]/27/2015  ? Hyperlipidemia   ? Hypertension   ? Hypothyroid   ? Migraine   ? NONE RECENT  ? ? ?PAST SURGICAL HISTORY: ?Past Surgical History:  ?Procedure Laterality Date  ? COLONOSCOPY WITH PROPOFOL N/A 09/10/2015  ? Procedure: COLONOSCOPY WITH PROPOFOL;  Surgeon: MaGarlan FairMD;  Location: [facility];  Service: Endoscopy;  Laterality: N/A;  ? COLONSCOPY  FEW YRS AGO  ? 2 SMALL POLYPS  ? TOTAL HIP ARTHROPLASTY Right 2011  ? Dr. AlWynelle Link? TUBAL LIGATION    ? ? ?FAMILY HISTORY: ?Family History  ?Problem Relation Age of Onset  ? Diabetes Mother   ? Diabetes Father   ? ? ?SOCIAL HISTORY: ?Social History  ? ?Socioeconomic History  ? Marital status: Widowed  ?  Spouse name: Not on file  ? Number of  children: Not on file  ? Years of education: Not on file  ? Highest education level: Not on file  ?Occupational History  ? Occupation: retired  ?  Employer: [employer]  ?Tobacco Use  ? Smoking status: Never  ? Smokeless tobacco: Never  ?Vaping Use  ? Vaping Use: Never used  ?Substance and Sexual Activity  ? Alcohol use: Not Currently  ?  Comment: SELDOM  ? Drug use: No  ? Sexual activity: Not Currently  ?Other Topics Concern  ? Not on file  ?Social History Narrative  ? Worked 53 years for [employer]  ? Married  ? Children and Grandchildren  ? Kim Cole mother  ? Right Handed  ? Drinks a soda seldomly  ? ?Social Determinants of Health  ? ?Financial Resource Strain: Not on file  ?Food Insecurity: Not on file  ?Transportation Needs: Not on file  ?Physical Activity: Not on file  ?Stress: Not on file  ?Social Connections: Not on file  ?Intimate Partner Violence: Not on file  ? ? ? ?PHYSICAL EXAM ?  ?Vitals:  ? 06/10/21 0913  ?BP: (!) 145/68  ?Pulse: 60  ?Weight: 147 lb 8 oz (66.9 kg)  ?Height: 5' 4"  (1.626 m)  ? ?Not recorded ?  ? ? ?Body mass index is 25.32 kg/m?. ? ?PHYSICAL EXAMNIATION: ? ?Gen: NAD, conversant, well nourised, well groomed                     ?Cardiovascular: Regular rate rhythm, no peripheral edema, warm, nontender. ?Eyes: Conjunctivae clear without exudates or hemorrhage ?Pulmonary: Clear to auscultation bilaterally  ? ?NEUROLOGICAL EXAM: ? ?MENTAL STATUS: ? ?Montreal Cognitive Assessment  06/10/2021  ?Visuospatial/ Executive (0/5) 0  ?Naming (0/3) 2  ?Attention: Read list of digits (0/2) 1  ?Attention: Read list of letters (0/1) 1  ?Attention: Serial 7 subtraction starting at 100 (0/3) 0  ?Language: Repeat phrase (0/2) 2  ?Language : Fluency (0/1) 0  ?Abstraction (0/2) 1  ?Delayed Recall (0/5) 0  ?Orientation (0/6) 1  ?Total 8  ?Adjusted Score (based on education) 9  ?  ?  ?CRANIAL NERVES: ?CN II: Visual fields are full to confrontation. Pupils are round equal and briskly reactive to light. ?CN III, IV, VI: extraocular movement are normal. No ptosis. ?CN V: Facial sensation is intact to light touch ?CN VII: Face is symmetric with normal eye closure  ?CN VIII: Hearing is normal to causal conversation. ?CN IX, X: Phonation is normal. ?CN XI: Head turning and shoulder shrug are intact ? ?MOTOR: Right hand resting tremor, left more than right upper and lower extremity mild bradykinesia, rigidity, mild nuchal rigidity, ? ?REFLEXES: Brisk and symmetric ? ?SENSORY: ?Intact to light  touch ? ?COORDINATION: ?Finger-nose-finger and heel-to-shin is normal bilaterally. Mild rest hand resting tremor noted. ? ?GAIT/STANCE: ?Need to push-up to get up from seated position, limp on left, rely on her cane ? ?DIAGNOSTIC DATA (LABS, IMAGING, TESTING) ?- I reviewed patient records, labs, notes, testing and imaging myself where available. ? ? ?ASSESSMENT AND PLAN ? ?Kim Cole is a 76 y.o. female   ? ?Worsening dementia ? MoCA examination 9/30 ? Personally reviewed MRI of the brain in December 2021, mild atrophy, no acute abnormality, ? Laboratory evaluation showed B12 deficiency in 21, after supplement, the level was within normal limit ? Doing well on Aricept, Namenda ? Good social support ? ?Gait abnormality ? 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 I have suggested a trial of Sinemet, patient does not want to do it, she refused physical therapy as well, ? Return to clinic in 1 year, ? ?Marcial Pacas, M.D. Ph.D. ? ?[facility] ?[address], [place] [address] ?Phone: [phone] ?Fax:      [phone]  ?

## 2021-07-11 ENCOUNTER — Telehealth: Payer: Self-pay | Admitting: Neurology

## 2021-07-11 NOTE — Telephone Encounter (Signed)
Pt's son states his mother was believed to have Parkinson's and according to his wife (who attended last appointment with pt) Pt's son is asking for a call for the name of the medication and if it has actually been called in for pt. ?

## 2021-07-11 NOTE — Telephone Encounter (Signed)
Spoke to son ?states after reviewing information with his mother he want's to start Sinemet and Physical Therapy for his mother, since her sx have worsen. ? ? ? ? ?"Gait abnormality ?            Mainly due to her left hip pain, she has a long history of slow worsening right hand resting tremor, mild parkinsonian on today's examination, left more than right rigidity, bradykinesia, ?            I have suggested a trial of Sinemet, patient does not want to do it, she refused physical therapy as well, ?            Return to clinic in 1 year," ?  ?Marcial Pacas, M.D. Ph.D. ?06/10/2021 ?[facility] ?

## 2021-08-30 ENCOUNTER — Telehealth: Payer: Self-pay | Admitting: Family Medicine

## 2021-08-30 NOTE — Telephone Encounter (Signed)
Spoke with [employer] and gave verbal order:  Okay to change from Lannette to the [place] brand levothyroxine per Dr. Diona Browner.

## 2021-08-30 NOTE — Telephone Encounter (Signed)
Needs clarification on levothroxine and wants to know if it is ok to chagne from Lannette to the [place] brand [phone]

## 2021-08-30 NOTE — Telephone Encounter (Signed)
Okay to make the change of brands.  If she would like to determine if she is responding in a similar way to it she can return for thyroid testing 4 to 6 weeks after the change.

## 2021-09-10 ENCOUNTER — Other Ambulatory Visit: Payer: Self-pay | Admitting: Family Medicine

## 2021-09-26 ENCOUNTER — Other Ambulatory Visit: Payer: Self-pay | Admitting: Family Medicine

## 2021-09-26 DIAGNOSIS — Z79899 Other long term (current) drug therapy: Secondary | ICD-10-CM

## 2021-09-26 DIAGNOSIS — E038 Other specified hypothyroidism: Secondary | ICD-10-CM

## 2021-09-26 DIAGNOSIS — R7989 Other specified abnormal findings of blood chemistry: Secondary | ICD-10-CM

## 2021-09-26 DIAGNOSIS — E785 Hyperlipidemia, unspecified: Secondary | ICD-10-CM

## 2021-09-26 DIAGNOSIS — E1169 Type 2 diabetes mellitus with other specified complication: Secondary | ICD-10-CM

## 2021-10-02 ENCOUNTER — Other Ambulatory Visit (INDEPENDENT_AMBULATORY_CARE_PROVIDER_SITE_OTHER): Payer: PPO

## 2021-10-02 DIAGNOSIS — Z79899 Other long term (current) drug therapy: Secondary | ICD-10-CM

## 2021-10-02 DIAGNOSIS — E669 Obesity, unspecified: Secondary | ICD-10-CM | POA: Diagnosis not present

## 2021-10-02 DIAGNOSIS — E1169 Type 2 diabetes mellitus with other specified complication: Secondary | ICD-10-CM | POA: Diagnosis not present

## 2021-10-02 DIAGNOSIS — E785 Hyperlipidemia, unspecified: Secondary | ICD-10-CM

## 2021-10-02 DIAGNOSIS — E038 Other specified hypothyroidism: Secondary | ICD-10-CM

## 2021-10-02 LAB — LIPID PANEL
Cholesterol: 177 mg/dL (ref 0–200)
HDL: 62.2 mg/dL (ref >39.00)
LDL Cholesterol: 97 mg/dL (ref 0–99)
NonHDL: 114.39
Total CHOL/HDL Ratio: 3
Triglycerides: 85 mg/dL (ref 0.0–149.0)
VLDL: 17 mg/dL (ref 0.0–40.0)

## 2021-10-02 LAB — HEPATIC FUNCTION PANEL
ALT: 8 U/L (ref 0–35)
AST: 14 U/L (ref 0–37)
Albumin: 4.2 g/dL (ref 3.5–5.2)
Alkaline Phosphatase: 101 U/L (ref 39–117)
Bilirubin, Direct: 0.2 mg/dL (ref 0.0–0.3)
Total Bilirubin: 0.7 mg/dL (ref 0.2–1.2)
Total Protein: 6.7 g/dL (ref 6.0–8.3)

## 2021-10-02 LAB — CBC WITH DIFFERENTIAL/PLATELET
Basophils Absolute: 0 10*3/uL (ref 0.0–0.1)
Basophils Relative: 0.5 % (ref 0.0–3.0)
Eosinophils Absolute: 0 10*3/uL (ref 0.0–0.7)
Eosinophils Relative: 0.7 % (ref 0.0–5.0)
HCT: 41.4 % (ref 36.0–46.0)
Hemoglobin: 13.7 g/dL (ref 12.0–15.0)
Lymphocytes Relative: 26.9 % (ref 12.0–46.0)
Lymphs Abs: 1.6 10*3/uL (ref 0.7–4.0)
MCHC: 33.2 g/dL (ref 30.0–36.0)
MCV: 89.5 fl (ref 78.0–100.0)
Monocytes Absolute: 0.4 10*3/uL (ref 0.1–1.0)
Monocytes Relative: 6.5 % (ref 3.0–12.0)
Neutro Abs: 4 10*3/uL (ref 1.4–7.7)
Neutrophils Relative %: 65.4 % (ref 43.0–77.0)
Platelets: 188 10*3/uL (ref 150.0–400.0)
RBC: 4.62 Mil/uL (ref 3.87–5.11)
RDW: 15 % (ref 11.5–15.5)
WBC: 6.1 10*3/uL (ref 4.0–10.5)

## 2021-10-02 LAB — MICROALBUMIN / CREATININE URINE RATIO
Creatinine,U: 150.4 mg/dL
Microalb Creat Ratio: 7 mg/g (ref 0.0–30.0)
Microalb, Ur: 10.5 mg/dL — ABNORMAL HIGH (ref 0.0–1.9)

## 2021-10-02 LAB — BASIC METABOLIC PANEL
BUN: 22 mg/dL (ref 6–23)
CO2: 28 mEq/L (ref 19–32)
Calcium: 9.5 mg/dL (ref 8.4–10.5)
Chloride: 103 mEq/L (ref 96–112)
Creatinine, Ser: 1.31 mg/dL — ABNORMAL HIGH (ref 0.40–1.20)
GFR: 40.08 mL/min — ABNORMAL LOW (ref >60.00)
Glucose, Bld: 120 mg/dL — ABNORMAL HIGH (ref 70–99)
Potassium: 3.7 mEq/L (ref 3.5–5.1)
Sodium: 138 mEq/L (ref 135–145)

## 2021-10-02 LAB — TSH: TSH: 30.48 u[IU]/mL — ABNORMAL HIGH (ref 0.35–5.50)

## 2021-10-02 LAB — T3, FREE: T3, Free: 2.7 pg/mL (ref 2.3–4.2)

## 2021-10-02 LAB — T4, FREE: Free T4: 0.75 ng/dL (ref 0.60–1.60)

## 2021-10-02 LAB — HEMOGLOBIN A1C: Hgb A1c MFr Bld: 5.2 % (ref 4.6–6.5)

## 2021-10-07 ENCOUNTER — Ambulatory Visit (INDEPENDENT_AMBULATORY_CARE_PROVIDER_SITE_OTHER): Payer: PPO | Admitting: Family Medicine

## 2021-10-07 ENCOUNTER — Encounter: Payer: Self-pay | Admitting: Family Medicine

## 2021-10-07 VITALS — BP 148/74 | HR 64 | Temp 98.2°F | Ht 64.25 in | Wt 150.2 lb

## 2021-10-07 DIAGNOSIS — Z0000 Encounter for general adult medical examination without abnormal findings: Secondary | ICD-10-CM

## 2021-10-07 NOTE — Progress Notes (Addendum)
Kim Cole T. Kim Haralson, MD, [place] at [facility] [place], [address]  Phone: [phone]  FAX: [place] - 76 y.o. female  MRN [id number]  Date of Birth: [birth year]  Date: 10/07/2021  PCP: Owens Loffler, MD  Referral: Owens Loffler, MD  Chief Complaint  Patient presents with   Medicare Wellness   Patient Care Team: Owens Loffler, MD as PCP - General (Family Medicine) Subjective:   Kim Cole is a 76 y.o. pleasant patient who presents for a medicare wellness examination:  Health Maintenance Summary Reviewed and updated, unless pt declines services.  Tobacco History Reviewed. Non-smoker Alcohol: No concerns, no excessive use Exercise Habits: Some activity, rec at least 30 mins 5 times a week STD concerns: none Drug Use: None Birth control method: n/a Menses regular: n/a Lumps or breast concerns: no Breast Cancer Family History: no  F/u alzheimer's -this is progressing, she is still able to live independently.  Your son does do her grocery shopping for her.  She does do some minor cooking still.  Diabetes Mellitus: Tolerating Medications: yes Compliance with diet: fair, Body mass index is 25.59 kg/m. Exercise: minimal / intermittent Avg blood sugars at home: not checking Foot problems: none Hypoglycemia: none No nausea, vomitting, blurred vision, polyuria.  Lab Results  Component Value Date   HGBA1C 5.2 10/02/2021   HGBA1C 4.7 10/03/2019   HGBA1C 5.1 11/10/2018   Lab Results  Component Value Date   MICROALBUR 10.5 (H) 10/02/2021   LDLCALC 97 10/02/2021   CREATININE 1.31 (H) 10/02/2021    Wt Readings from Last 3 Encounters:  10/07/21 150 lb 4 oz (68.2 kg)  06/10/21 147 lb 8 oz (66.9 kg)  08/06/20 136 lb (61.7 kg)     Shingrix Covid Vaccine, #3 Flu vaccine  Hypothyroid - t3 and t4 are normal She has been hypothyroid for years.  Her daughter-in-law  thinks that she possibly could have missed some doses, she is generally fairly good with that.  TSH is high today, but free T3 and free T4 are normal.  Historically, her thyroid levels have been stable.   Health Maintenance  Topic Date Due   TETANUS/TDAP  Never done   Zoster Vaccines- Shingrix (1 of 2) Never done   OPHTHALMOLOGY EXAM  03/31/2016   FOOT EXAM  07/18/2017   COVID-19 Vaccine (3 - Pfizer risk series) 08/10/2019   MAMMOGRAM  09/23/2019   DEXA SCAN  09/23/2019   COLONOSCOPY (Pts 45-2yr Insurance coverage will need to be confirmed)  09/09/2020   INFLUENZA VACCINE  10/22/2021   HEMOGLOBIN A1C  04/04/2022   URINE MICROALBUMIN  10/03/2022   Pneumonia Vaccine 75 Years old  Completed   Hepatitis C Screening  Completed   HPV VACCINES  Aged Out   Immunization History  Administered Date(s) Administered   Fluad Quad(high Dose 65+) 01/26/2020   Influenza, High Dose Seasonal PF 04/26/2015, 01/12/2016, 02/07/2017, 01/18/2018, 01/10/2019   PFIZER(Purple Top)SARS-COV-2 Vaccination 06/22/2019, 07/13/2019   Pneumococcal Conjugate-13 11/16/2013   Pneumococcal Polysaccharide-23 10/11/2012    Patient Active Problem List   Diagnosis Date Noted   Alzheimer's disease ([place] 01/28/2020    Priority: High   Diabetes mellitus type 2 in obese ([place] 11/17/2013    Priority: High   Hyperlipidemia     Priority: Medium    Hypertension     Priority: Medium    Hypothyroid     Priority: Medium    Vitamin B 12  deficiency 02/09/2020   Allergic rhinitis due to pollen    Migraine     Past Medical History:  Diagnosis Date   Allergic rhinitis due to pollen    Alzheimer's disease ([employer]) 01/28/2020   Anemia    Arthritis    Diabetes mellitus type 2 in obese ([place]) 11/17/2013   Hyperlipidemia    Hypertension    Hypothyroid    Migraine    NONE RECENT    Past Surgical History:  Procedure Laterality Date   COLONOSCOPY WITH PROPOFOL N/A 09/10/2015   Procedure: COLONOSCOPY WITH PROPOFOL;  Surgeon:  Garlan Fair, MD;  [facility]: [facility];  Service: Endoscopy;  Laterality: N/A;   COLONSCOPY  FEW YRS AGO   2 SMALL POLYPS   TOTAL HIP ARTHROPLASTY Right 2011   Dr. Wynelle Link   TUBAL LIGATION      Family History  Problem Relation Age of Onset   Diabetes Mother    Diabetes Father     Social History   Social History Narrative   Worked 38 years for [employer]   Married   Children and Grandchildren   Legrand Como Koper's mother   Right Handed   Drinks a soda seldomly    Past Medical History, Surgical History, Social History, Family History, Problem List, Medications, and Allergies have been reviewed and updated if relevant.  Review of Systems: Pertinent positives are listed above.  Otherwise, a full 14 point review of systems has been done in full and it is negative except where it is noted positive.  Objective:   BP (!) 148/74   Pulse 64   Temp 98.2 F (36.8 C) (Oral)   Ht 5' 4.25" (1.632 m)   Wt 150 lb 4 oz (68.2 kg)   PF 97 L/min   BMI 25.59 kg/m     07/22/2017    9:22 AM 11/05/2018   12:32 PM 10/03/2019    2:30 PM 11/18/2019   11:57 AM 10/07/2021   10:35 AM  Fall Risk  Falls in the past year? Yes 0 1  0  Was there an injury with Fall? No  1    Fall Risk Category Calculator   2    Fall Risk Category   Moderate    Patient Fall Risk Level   Low fall risk Moderate fall risk   Patient at Risk for Falls Due to  Medication side effect     Fall risk Follow up  Falls evaluation completed;Falls prevention discussed      Ideal Body Weight:  Weight in (lb) to have BMI = 25: 146.5 Hearing Screening  Method: Audiometry   '500Hz'$  '1000Hz'$  '2000Hz'$  '4000Hz'$   Right ear '20 20 20 20  '$ Left ear '20 20 20 20  '$ Vision Screening - Comments:: Wears Glasses-No recent eye exam    10/07/2021   10:35 AM 11/05/2018   12:32 PM 07/22/2017    9:22 AM 07/17/2016    4:23 PM 03/29/2015    5:27 PM  Depression screen PHQ 2/9  Decreased Interest 0 0 0 0 0  Down,  Depressed, Hopeless 0 0 0 0 0  PHQ - 2 Score 0 0 0 0 0  Altered sleeping  0 0    Tired, decreased energy  0 0    Change in appetite  0 0    Feeling bad or failure about yourself   0 0    Trouble concentrating  0 0    Moving slowly or fidgety/restless  0 0  Suicidal thoughts  0 0    PHQ-9 Score  0 0    Difficult doing work/chores   Not difficult at all       GEN: well developed, well nourished, no acute distress Eyes: conjunctiva and lids normal, PERRLA, EOMI ENT: TM clear, nares clear, oral exam WNL Neck: supple, no lymphadenopathy, no thyromegaly, no JVD Pulm: clear to auscultation and percussion, respiratory effort normal CV: regular rate and rhythm, S1-S2, no murmur, rub or gallop, no bruits Chest: no scars, masses, no lumps BREAST: no lumps, no axillary LAD, no nipple discharge GI: soft, non-tender; no hepatosplenomegaly, masses; active bowel sounds all quadrants GU: Normal external female genitalia. Cervix appears intact without lesions or irritation. Vaginal canal normal without ulceration or lesion. Cervix NT to exam. Ovaries neither enlarged nor tender. (Chaperoned examination by female staff) Lymph: no cervical, axillary or inguinal adenopathy MSK: gait normal, muscle tone and strength WNL, no joint swelling, effusions, discoloration, crepitus  SKIN: clear, good turgor, color WNL, no rashes, lesions, or ulcerations Neuro: normal mental status, normal strength, sensation, and motion Psych: alert; oriented to person, place and time, normally interactive and not anxious or depressed in appearance.  All labs reviewed with patient.  Results for orders placed or performed in visit on 10/02/21  T4, free  Result Value Ref Range   Free T4 0.75 0.60 - 1.60 ng/dL  Lipid panel  Result Value Ref Range   Cholesterol 177 0 - 200 mg/dL   Triglycerides 85.0 0.0 - 149.0 mg/dL   HDL 62.20 >39.00 mg/dL   VLDL 17.0 0.0 - 40.0 mg/dL   LDL Cholesterol 97 0 - 99 mg/dL   Total CHOL/HDL  Ratio 3    NonHDL 114.39   Hepatic function panel  Result Value Ref Range   Total Bilirubin 0.7 0.2 - 1.2 mg/dL   Bilirubin, Direct 0.2 0.0 - 0.3 mg/dL   Alkaline Phosphatase 101 39 - 117 U/L   AST 14 0 - 37 U/L   ALT 8 0 - 35 U/L   Total Protein 6.7 6.0 - 8.3 g/dL   Albumin 4.2 3.5 - 5.2 g/dL  Basic metabolic panel  Result Value Ref Range   Sodium 138 135 - 145 mEq/L   Potassium 3.7 3.5 - 5.1 mEq/L   Chloride 103 96 - 112 mEq/L   CO2 28 19 - 32 mEq/L   Glucose, Bld 120 (H) 70 - 99 mg/dL   BUN 22 6 - 23 mg/dL   Creatinine, Ser 1.31 (H) 0.40 - 1.20 mg/dL   GFR 40.08 (L) >60.00 mL/min   Calcium 9.5 8.4 - 10.5 mg/dL  CBC with Differential/Platelet  Result Value Ref Range   WBC 6.1 4.0 - 10.5 K/uL   RBC 4.62 3.87 - 5.11 Mil/uL   Hemoglobin 13.7 12.0 - 15.0 g/dL   HCT 41.4 36.0 - 46.0 %   MCV 89.5 78.0 - 100.0 fl   MCHC 33.2 30.0 - 36.0 g/dL   RDW 15.0 11.5 - 15.5 %   Platelets 188.0 150.0 - 400.0 K/uL   Neutrophils Relative % 65.4 43.0 - 77.0 %   Lymphocytes Relative 26.9 12.0 - 46.0 %   Monocytes Relative 6.5 3.0 - 12.0 %   Eosinophils Relative 0.7 0.0 - 5.0 %   Basophils Relative 0.5 0.0 - 3.0 %   Neutro Abs 4.0 1.4 - 7.7 K/uL   Lymphs Abs 1.6 0.7 - 4.0 K/uL   Monocytes Absolute 0.4 0.1 - 1.0 K/uL   Eosinophils Absolute 0.0  0.0 - 0.7 K/uL   Basophils Absolute 0.0 0.0 - 0.1 K/uL  Hemoglobin A1c  Result Value Ref Range   Hgb A1c MFr Bld 5.2 4.6 - 6.5 %  TSH  Result Value Ref Range   TSH 30.48 (H) 0.35 - 5.50 uIU/mL  T3, free  Result Value Ref Range   T3, Free 2.7 2.3 - 4.2 pg/mL  Microalbumin / creatinine urine ratio  Result Value Ref Range   Microalb, Ur 10.5 (H) 0.0 - 1.9 mg/dL   Creatinine,U 150.4 mg/dL   Microalb Creat Ratio 7.0 0.0 - 30.0 mg/g    No results found.  Assessment and Plan:     ICD-10-CM   1. Healthcare maintenance  Z00.00      Dementia is progressing.  She does see neurology, she is compliant taking her Aricept and Namenda.  She is still  able to be somewhat independent, do some minor cooking, and care for house with some additional help with her son and daughter-in-law.  Obtain vaccines as below.   Otherwise she is fairly stable medically, and I encouraged her to keep doing as she has been doing.  She is limited somewhat from activities with her use of a cane and balance as well as pain, but I encouraged her to keep doing as much as she can at least on indoor level.  While her TSH is high, her free T3 and free T4 levels are normal.  With long-term normal thyroid studies, I think I would rather keep her at the same dose.  With her dementia, it is certainly possible she could have missed a dose prior to the blood draw, and I think it may be more harmful to potentially induce some iatrogenic hyperthyroidism.  Patient Instructions  Shingrix vaccine - shingles vaccine, newer booster.  Get flu and Covid-19.       Health Maintenance Exam: The patient's preventative maintenance and recommended screening tests for an annual wellness exam were reviewed in full today. Brought up to date unless services declined.  Counselled on the importance of diet, exercise, and its role in overall health and mortality. The patient's FH and SH was reviewed, including their home life, tobacco status, and drug and alcohol status.  Follow-up in 1 year for physical exam or additional follow-up below.  I have personally reviewed the Medicare Annual Wellness questionnaire and have noted 1. The patient's medical and social history 2. Their use of alcohol, tobacco or illicit drugs 3. Their current medications and supplements 4. The patient's functional ability including ADL's, fall risks, home safety risks and hearing or visual             impairment. 5. Diet and physical activities 6. Evidence for depression or mood disorders 7. Reviewed Updated provider list, see scanned forms and [facility] Snapshot.  8. Reviewed whether or not the patient has HCPOA or  living will, and discussed what this means with the patient.  Recommended she bring in a copy for his chart in [facility].  The patients weight, height, BMI and visual acuity have been recorded in the chart I have made referrals, counseling and provided education to the patient based review of the above and I have provided the pt with a written personalized care plan for preventive services.  I have provided the patient with a copy of your personalized plan for preventive services. Instructed to take the time to review along with their updated medication list.  Follow-up: No follow-ups on file.  Future Appointments  Date  Time Provider [place]  06/11/2022  1:15 PM Suzzanne Cloud, NP [facility] None    No orders of the defined types were placed in this encounter.  There are no discontinued medications. No orders of the defined types were placed in this encounter.   Signed,  Maud Deed. Maryland Luppino, MD   Allergies as of 10/07/2021   Not on File      Medication List        Accurate as of October 07, 2021  2:07 PM. If you have any questions, ask your nurse or doctor.          Accu-Chek FastClix Lancets Misc Use to check blood sugar once daily.  Dx: E11.9   donepezil 10 MG tablet Commonly known as: ARICEPT Take 1 tablet (10 mg total) by mouth at bedtime.   levothyroxine 75 MCG tablet Commonly known as: SYNTHROID Take 1 tablet by mouth daily before breakfast (dose change- deactivated 58mg)   memantine 10 MG tablet Commonly known as: NAMENDA Take 1 tablet (10 mg total) by mouth 2 (two) times daily.   simvastatin 20 MG tablet Commonly known as: ZOCOR Take 1 tablet by mouth every evening (Need to make an appointment for further refills)   vitamin B-12 1000 MCG tablet Commonly known as: CYANOCOBALAMIN Take 1,000 mcg by mouth daily.

## 2021-10-07 NOTE — Patient Instructions (Signed)
Shingrix vaccine - shingles vaccine, newer booster.  Get flu and Covid-19.

## 2021-11-08 ENCOUNTER — Other Ambulatory Visit: Payer: Self-pay

## 2021-11-08 MED ORDER — LEVOTHYROXINE SODIUM 75 MCG PO TABS: ORAL_TABLET | ORAL | 3 refills | Status: DC

## 2021-12-24 ENCOUNTER — Telehealth: Payer: Self-pay | Admitting: Family Medicine

## 2021-12-24 MED ORDER — MEMANTINE HCL 10 MG PO TABS: 10.0000 mg | ORAL_TABLET | Freq: Two times a day (BID) | ORAL | 3 refills | Status: DC

## 2021-12-24 NOTE — Telephone Encounter (Signed)
Refill sent as requested. 

## 2021-12-24 NOTE — Telephone Encounter (Signed)
  Encourage patient to contact the pharmacy for refills or they can request refills through [facility]  Did the patient contact the pharmacy: Yes  LAST APPOINTMENT DATE: 10/07/2021  NEXT APPOINTMENT DATE: N/A  MEDICATION: memantine (NAMENDA) 10 MG tablet  Is the patient out of medication? Yes  PH[facility]ACY: [facility] ([place], [place]  Let patient know to contact pharmacy at the end of the day to make sure medication is ready.  Please notify patient to allow 48-72 hours to process

## 2022-01-06 ENCOUNTER — Other Ambulatory Visit: Payer: Self-pay | Admitting: *Deleted

## 2022-01-06 MED ORDER — SIMVASTATIN 20 MG PO TABS: ORAL_TABLET | ORAL | 3 refills | Status: DC

## 2022-04-15 ENCOUNTER — Telehealth: Payer: Self-pay | Admitting: Family Medicine

## 2022-04-15 NOTE — Telephone Encounter (Signed)
Prescription Request  04/15/2022  Is this a "Controlled Substance" medicine? No  LOV: 10/07/2021  What is the name of the medication or equipment? levothyroxine (SYNTHROID) 75 MCG tablet   Have you contacted your pharmacy to request a refill? No   Which pharmacy would you like this sent to?   [facility]) - [place], [address] [place] [address] Phone: [phone] Fax: [phone]    Patient notified that their request is being sent to the clinical staff for review and that they should receive a response within 2 business days.   Please advise at Mobile [phone] (mobile)

## 2022-04-15 NOTE — Telephone Encounter (Signed)
Left message for Annie Main that Ms. Fahmy should have available refills on file at [facility] for the Levothyroxine 75 mcg.  Rx was sent on 11/08/21 for #90 with 3 refills.  I ask that he call [facility] and if he has any issues getting refills to call me back.

## 2022-06-11 ENCOUNTER — Ambulatory Visit: Payer: PPO | Admitting: Neurology

## 2022-07-07 ENCOUNTER — Telehealth: Payer: Self-pay | Admitting: Family Medicine

## 2022-07-07 MED ORDER — DONEPEZIL HCL 10 MG PO TABS: 10.0000 mg | ORAL_TABLET | Freq: Every day | ORAL | 0 refills | Status: DC

## 2022-07-07 MED ORDER — SIMVASTATIN 20 MG PO TABS: ORAL_TABLET | ORAL | 0 refills | Status: DC

## 2022-07-07 NOTE — Telephone Encounter (Signed)
Refill sent as requested. 

## 2022-07-07 NOTE — Telephone Encounter (Signed)
Prescription Request  07/07/2022  LOV: 10/07/2021  What is the name of the medication or equipment?  donepezil (ARICEPT) 10 MG tablet  simvastatin (ZOCOR) 20 MG tablet  Have you contacted your pharmacy to request a refill? No   Which pharmacy would you like this sent to?   [facility], [place] - [address] [place] [address] [place] [address] Phone: [phone] Fax: [phone]    Patient notified that their request is being sent to the clinical staff for review and that they should receive a response within 2 business days.   Please advise at [facility] [phone]

## 2022-08-26 ENCOUNTER — Other Ambulatory Visit: Payer: Self-pay

## 2022-08-26 ENCOUNTER — Telehealth: Payer: Self-pay | Admitting: Family Medicine

## 2022-08-26 MED ORDER — LEVOTHYROXINE SODIUM 75 MCG PO TABS: ORAL_TABLET | ORAL | 0 refills | Status: DC

## 2022-08-26 NOTE — Telephone Encounter (Signed)
Prescription Request  08/26/2022  LOV: 10/07/2021  What is the name of the medication or equipment? levothyroxine (SYNTHROID) 75 MCG tablet [[id number]]   Have you contacted your pharmacy to request a refill? No   Which pharmacy would you like this sent to?    [facility], [place] - [address] [place] [address] [place] [address] Phone: [phone] Fax: [phone]    Patient notified that their request is being sent to the clinical staff for review and that they should receive a response within 2 business days.   Please advise at Mobile [phone] (mobile)

## 2022-08-26 NOTE — Telephone Encounter (Signed)
Called son on [place]. Mail order told him they did not have anymore refills. Patient has follow up scheduled for July. I have sent in one 90 day refill to last until then.

## 2022-09-11 ENCOUNTER — Telehealth: Payer: Self-pay

## 2022-09-11 NOTE — Patient Outreach (Signed)
  Care Coordination   09/11/2022 Name: Kim Cole MRN: [id number] DOB: October 28, 1945   Care Coordination Outreach Attempts:  An unsuccessful telephone outreach was attempted today to offer the patient information about available care coordination services. HIPAA compliant message left.   Follow Up Plan:  Additional outreach attempts will be made to offer the patient care coordination information and services.   Encounter Outcome:  No Answer   Care Coordination Interventions:  No, not indicated    [facility] In[facility] Care Coordination [phone] direct line

## 2022-09-23 ENCOUNTER — Other Ambulatory Visit: Payer: Self-pay | Admitting: Family Medicine

## 2022-09-23 DIAGNOSIS — Z79899 Other long term (current) drug therapy: Secondary | ICD-10-CM

## 2022-09-23 DIAGNOSIS — E119 Type 2 diabetes mellitus without complications: Secondary | ICD-10-CM

## 2022-09-23 DIAGNOSIS — E538 Deficiency of other specified B group vitamins: Secondary | ICD-10-CM

## 2022-09-23 DIAGNOSIS — E038 Other specified hypothyroidism: Secondary | ICD-10-CM

## 2022-09-23 DIAGNOSIS — E782 Mixed hyperlipidemia: Secondary | ICD-10-CM

## 2022-09-26 ENCOUNTER — Telehealth: Payer: Self-pay | Admitting: Family Medicine

## 2022-09-26 MED ORDER — DONEPEZIL HCL 10 MG PO TABS: 10.0000 mg | ORAL_TABLET | Freq: Every day | ORAL | 0 refills | Status: DC

## 2022-09-26 NOTE — Telephone Encounter (Signed)
Prescription Request  09/26/2022  LOV: 10/07/2021  What is the name of the medication or equipment? memantine (NAMENDA) 10 MG tablet   Have you contacted your pharmacy to request a refill? No   Which pharmacy would you like this sent to?   [facility], [place] - [address] [place] [address] [place] [address] Phone: [phone] Fax: [phone]    Patient notified that their request is being sent to the clinical staff for review and that they should receive a response within 2 business days.   Please advise at [place] [phone] (mobile)  Patient will be out of this medication on Tuesday

## 2022-09-26 NOTE — Telephone Encounter (Signed)
Refill sent as requested. 

## 2022-10-06 ENCOUNTER — Other Ambulatory Visit (INDEPENDENT_AMBULATORY_CARE_PROVIDER_SITE_OTHER): Payer: PPO

## 2022-10-06 DIAGNOSIS — Z79899 Other long term (current) drug therapy: Secondary | ICD-10-CM | POA: Diagnosis not present

## 2022-10-06 DIAGNOSIS — E038 Other specified hypothyroidism: Secondary | ICD-10-CM | POA: Diagnosis not present

## 2022-10-06 DIAGNOSIS — E119 Type 2 diabetes mellitus without complications: Secondary | ICD-10-CM

## 2022-10-06 DIAGNOSIS — E538 Deficiency of other specified B group vitamins: Secondary | ICD-10-CM

## 2022-10-06 DIAGNOSIS — E782 Mixed hyperlipidemia: Secondary | ICD-10-CM

## 2022-10-06 LAB — T3, FREE: T3, Free: 3 pg/mL (ref 2.3–4.2)

## 2022-10-06 LAB — BASIC METABOLIC PANEL
BUN: 14 mg/dL (ref 6–23)
CO2: 29 mEq/L (ref 19–32)
Calcium: 9.5 mg/dL (ref 8.4–10.5)
Chloride: 105 mEq/L (ref 96–112)
Creatinine, Ser: 1.13 mg/dL (ref 0.40–1.20)
GFR: 47.52 mL/min — ABNORMAL LOW (ref >60.00)
Glucose, Bld: 98 mg/dL (ref 70–99)
Potassium: 4 mEq/L (ref 3.5–5.1)
Sodium: 141 mEq/L (ref 135–145)

## 2022-10-06 LAB — CBC WITH DIFFERENTIAL/PLATELET
Basophils Absolute: 0 10*3/uL (ref 0.0–0.1)
Basophils Relative: 0.4 % (ref 0.0–3.0)
Eosinophils Absolute: 0 10*3/uL (ref 0.0–0.7)
Eosinophils Relative: 0.7 % (ref 0.0–5.0)
HCT: 40 % (ref 36.0–46.0)
Hemoglobin: 13.2 g/dL (ref 12.0–15.0)
Lymphocytes Relative: 39.7 % (ref 12.0–46.0)
Lymphs Abs: 1.9 10*3/uL (ref 0.7–4.0)
MCHC: 32.9 g/dL (ref 30.0–36.0)
MCV: 90.9 fl (ref 78.0–100.0)
Monocytes Absolute: 0.3 10*3/uL (ref 0.1–1.0)
Monocytes Relative: 6.6 % (ref 3.0–12.0)
Neutro Abs: 2.6 10*3/uL (ref 1.4–7.7)
Neutrophils Relative %: 52.6 % (ref 43.0–77.0)
Platelets: 205 10*3/uL (ref 150.0–400.0)
RBC: 4.4 Mil/uL (ref 3.87–5.11)
RDW: 15.2 % (ref 11.5–15.5)
WBC: 4.9 10*3/uL (ref 4.0–10.5)

## 2022-10-06 LAB — TSH: TSH: 25.48 u[IU]/mL — ABNORMAL HIGH (ref 0.35–5.50)

## 2022-10-06 LAB — LIPID PANEL
Cholesterol: 165 mg/dL (ref 0–200)
HDL: 68.6 mg/dL (ref >39.00)
LDL Cholesterol: 74 mg/dL (ref 0–99)
NonHDL: 96.72
Total CHOL/HDL Ratio: 2
Triglycerides: 112 mg/dL (ref 0.0–149.0)
VLDL: 22.4 mg/dL (ref 0.0–40.0)

## 2022-10-06 LAB — HEPATIC FUNCTION PANEL
ALT: 12 U/L (ref 0–35)
AST: 17 U/L (ref 0–37)
Albumin: 3.9 g/dL (ref 3.5–5.2)
Alkaline Phosphatase: 96 U/L (ref 39–117)
Bilirubin, Direct: 0.2 mg/dL (ref 0.0–0.3)
Total Bilirubin: 0.8 mg/dL (ref 0.2–1.2)
Total Protein: 6.1 g/dL (ref 6.0–8.3)

## 2022-10-06 LAB — MICROALBUMIN / CREATININE URINE RATIO
Creatinine,U: 86.6 mg/dL
Microalb Creat Ratio: 4.6 mg/g (ref 0.0–30.0)
Microalb, Ur: 4 mg/dL — ABNORMAL HIGH (ref 0.0–1.9)

## 2022-10-06 LAB — T4, FREE: Free T4: 0.65 ng/dL (ref 0.60–1.60)

## 2022-10-06 LAB — HEMOGLOBIN A1C: Hgb A1c MFr Bld: 5.2 % (ref 4.6–6.5)

## 2022-10-06 LAB — VITAMIN B12: Vitamin B-12: 892 pg/mL (ref 211–911)

## 2022-10-11 NOTE — Progress Notes (Unsigned)
Ciria Bernardini T. Mihail Prettyman, MD, CAQ Sports Medicine [facility] [address] [place], [address]  Phone: [phone]  FAX: [phone]  Kim Cole - 77 y.o. female  MRN [id number]  Date of Birth: [birth year]  Date: 10/13/2022  PCP: Hannah Beat, MD  Referral: Hannah Beat, MD  No chief complaint on file.  Patient Care Team: Hannah Beat, MD as PCP - General (Family Medicine) Subjective:   Kim Cole is a 77 y.o. pleasant patient who presents for a medicare wellness examination:  Health Maintenance Summary Reviewed and updated, unless pt declines services.  She has progressively been continuing to get somewhat worse from her Alzheimer's disease.  Last time I saw her, she was still able to live independent with some help from family for basic life care and grocery shopping.  She has always been a very pleasant lady, and diabetes is recently been totally well-controlled.  This is in the context of no medications at this point.    Tobacco History Reviewed. Non-smoker Alcohol: No concerns, no excessive use Exercise Habits: Some activity, rec at least 30 mins 5 times a week STD concerns: none Drug Use: None Birth control method: n/a Menses regular: n/a Lumps or breast concerns: no Breast Cancer Family History: no  Health Maintenance  Topic Date Due   DTaP/Tdap/Td (1 - Tdap) Never done   Zoster Vaccines- Shingrix (1 of 2) Never done   OPHTHALMOLOGY EXAM  03/31/2016   FOOT EXAM  07/18/2017   COVID-19 Vaccine (3 - Pfizer risk series) 08/10/2019   DEXA SCAN  09/23/2019   Colonoscopy  09/09/2020   Medicare Annual Wellness (AWV)  10/08/2022   INFLUENZA VACCINE  10/23/2022   HEMOGLOBIN A1C  04/08/2023   Diabetic kidney evaluation - eGFR measurement  10/06/2023   Diabetic kidney evaluation - Urine ACR  10/06/2023   Pneumonia Vaccine 28+ Years old  Completed   Hepatitis C Screening  Completed   HPV VACCINES   Aged Out   Immunization History  Administered Date(s) Administered   Fluad Quad(high Dose 65+) 01/26/2020   Influenza, High Dose Seasonal PF 04/26/2015, 01/12/2016, 02/07/2017, 01/18/2018, 01/10/2019   PFIZER(Purple Top)SARS-COV-2 Vaccination 06/22/2019, 07/13/2019   Pneumococcal Conjugate-13 11/16/2013   Pneumococcal Polysaccharide-23 10/11/2012    Patient Active Problem List   Diagnosis Date Noted   Alzheimer's disease (HCC) 01/28/2020    Priority: High   Diabetes mellitus type 2 in obese (HCC) 11/17/2013    Priority: High   Hyperlipidemia     Priority: Medium    Hypertension     Priority: Medium    Hypothyroid     Priority: Medium    Vitamin B 12 deficiency 02/09/2020   Allergic rhinitis due to pollen    Migraine     Past Medical History:  Diagnosis Date   Allergic rhinitis due to pollen    Alzheimer's disease (HCC) 01/28/2020   Anemia    Arthritis    Diabetes mellitus type 2 in obese (HCC) 11/17/2013   Hyperlipidemia    Hypertension    Hypothyroid    Migraine    NONE RECENT    Past Surgical History:  Procedure Laterality Date   COLONOSCOPY WITH PROPOFOL N/A 09/10/2015   Procedure: COLONOSCOPY WITH PROPOFOL;  Surgeon: Charolett Bumpers, MD;  Location: [facility];  Service: Endoscopy;  Laterality: N/A;   COLONSCOPY  FEW YRS AGO   2 SMALL POLYPS   TOTAL HIP ARTHROPLASTY Right 2011   Dr. Lequita Halt  TUBAL LIGATION      Family History  Problem Relation Age of Onset   Diabetes Mother    Diabetes Father     Social History   Social History Narrative   Worked 40 years for [employer] / [employer]   Married   Children and Grandchildren   Casimiro Needle Hershkowitz's mother   Right Handed   Drinks a soda seldomly    Past Medical History, Surgical History, Social History, Family History, Problem List, Medications, and Allergies have been reviewed and updated if relevant.  Review of Systems: Pertinent positives are listed above.  Otherwise,  a full 14 point review of systems has been done in full and it is negative except where it is noted positive.  Objective:   There were no vitals taken for this visit.    07/22/2017    9:22 AM 11/05/2018   12:32 PM 10/03/2019    2:30 PM 11/18/2019   11:57 AM 10/07/2021   10:35 AM  Fall Risk  Falls in the past year? Yes 0 1  0  Was there an injury with Fall? No  1    Fall Risk Category Calculator   2    Fall Risk Category (Retired)   Moderate    (RETIRED) Patient Fall Risk Level   Low fall risk Moderate fall risk   Patient at Risk for Falls Due to  Medication side effect     Fall risk Follow up  Falls evaluation completed;Falls prevention discussed      Ideal Body Weight:   No results found.    10/07/2021   10:35 AM 11/05/2018   12:32 PM 07/22/2017    9:22 AM 07/17/2016    4:23 PM 03/29/2015    5:27 PM  Depression screen PHQ 2/9  Decreased Interest 0 0 0 0 0  Down, Depressed, Hopeless 0 0 0 0 0  PHQ - 2 Score 0 0 0 0 0  Altered sleeping  0 0    Tired, decreased energy  0 0    Change in appetite  0 0    Feeling bad or failure about yourself   0 0    Trouble concentrating  0 0    Moving slowly or fidgety/restless  0 0    Suicidal thoughts  0 0    PHQ-9 Score  0 0    Difficult doing work/chores   Not difficult at all       GEN: well developed, well nourished, no acute distress Eyes: conjunctiva and lids normal, PERRLA, EOMI ENT: TM clear, nares clear, oral exam WNL Neck: supple, no lymphadenopathy, no thyromegaly, no JVD Pulm: clear to auscultation and percussion, respiratory effort normal CV: regular rate and rhythm, S1-S2, no murmur, rub or gallop, no bruits Chest: no scars, masses, no lumps BREAST: no lumps, no axillary LAD, no nipple discharge GI: soft, non-tender; no hepatosplenomegaly, masses; active bowel sounds all quadrants GU: Normal external female genitalia. Cervix appears intact without lesions or irritation. Vaginal canal normal without ulceration or lesion. Cervix  NT to exam. Ovaries neither enlarged nor tender. (Chaperoned examination by female staff) Lymph: no cervical, axillary or inguinal adenopathy MSK: gait normal, muscle tone and strength WNL, no joint swelling, effusions, discoloration, crepitus  SKIN: clear, good turgor, color WNL, no rashes, lesions, or ulcerations Neuro: normal mental status, normal strength, sensation, and motion Psych: alert; oriented to person, place and time, normally interactive and not anxious or depressed in appearance.  All labs reviewed with patient.  Results for orders placed or performed in visit on 10/06/22  TSH  Result Value Ref Range   TSH 25.48 (H) 0.35 - 5.50 uIU/mL  Vitamin B12  Result Value Ref Range   Vitamin B-12 892 211 - 911 pg/mL  T3, free  Result Value Ref Range   T3, Free 3.0 2.3 - 4.2 pg/mL  T4, free  Result Value Ref Range   Free T4 0.65 0.60 - 1.60 ng/dL  Microalbumin / creatinine urine ratio  Result Value Ref Range   Microalb, Ur 4.0 (H) 0.0 - 1.9 mg/dL   Creatinine,U 42.5 mg/dL   Microalb Creat Ratio 4.6 0.0 - 30.0 mg/g  Lipid panel  Result Value Ref Range   Cholesterol 165 0 - 200 mg/dL   Triglycerides 956.3 0.0 - 149.0 mg/dL   HDL 87.56 >43.32 mg/dL   VLDL 95.1 0.0 - 88.4 mg/dL   LDL Cholesterol 74 0 - 99 mg/dL   Total CHOL/HDL Ratio 2    NonHDL 96.72   Hemoglobin A1c  Result Value Ref Range   Hgb A1c MFr Bld 5.2 4.6 - 6.5 %  Hepatic function panel  Result Value Ref Range   Total Bilirubin 0.8 0.2 - 1.2 mg/dL   Bilirubin, Direct 0.2 0.0 - 0.3 mg/dL   Alkaline Phosphatase 96 39 - 117 U/L   AST 17 0 - 37 U/L   ALT 12 0 - 35 U/L   Total Protein 6.1 6.0 - 8.3 g/dL   Albumin 3.9 3.5 - 5.2 g/dL  CBC with Differential/Platelet  Result Value Ref Range   WBC 4.9 4.0 - 10.5 K/uL   RBC 4.40 3.87 - 5.11 Mil/uL   Hemoglobin 13.2 12.0 - 15.0 g/dL   HCT 16.6 06.3 - 01.6 %   MCV 90.9 78.0 - 100.0 fl   MCHC 32.9 30.0 - 36.0 g/dL   RDW 01.0 93.2 - 35.5 %   Platelets 205.0 150.0  - 400.0 K/uL   Neutrophils Relative % 52.6 43.0 - 77.0 %   Lymphocytes Relative 39.7 12.0 - 46.0 %   Monocytes Relative 6.6 3.0 - 12.0 %   Eosinophils Relative 0.7 0.0 - 5.0 %   Basophils Relative 0.4 0.0 - 3.0 %   Neutro Abs 2.6 1.4 - 7.7 K/uL   Lymphs Abs 1.9 0.7 - 4.0 K/uL   Monocytes Absolute 0.3 0.1 - 1.0 K/uL   Eosinophils Absolute 0.0 0.0 - 0.7 K/uL   Basophils Absolute 0.0 0.0 - 0.1 K/uL  Basic metabolic panel  Result Value Ref Range   Sodium 141 135 - 145 mEq/L   Potassium 4.0 3.5 - 5.1 mEq/L   Chloride 105 96 - 112 mEq/L   CO2 29 19 - 32 mEq/L   Glucose, Bld 98 70 - 99 mg/dL   BUN 14 6 - 23 mg/dL   Creatinine, Ser 7.32 0.40 - 1.20 mg/dL   GFR 20.25 (L) >42.70 mL/min   Calcium 9.5 8.4 - 10.5 mg/dL    No results found.  Assessment and Plan:   No diagnosis found.  Health Maintenance Exam: The patient's preventative maintenance and recommended screening tests for an annual wellness exam were reviewed in full today. Brought up to date unless services declined.  Counselled on the importance of diet, exercise, and its role in overall health and mortality. The patient's FH and SH was reviewed, including their home life, tobacco status, and drug and alcohol status.  Follow-up in 1 year for physical exam or additional follow-up below.  I have  personally reviewed the Medicare Annual Wellness questionnaire and have noted 1. The patient's medical and social history 2. Their use of alcohol, tobacco or illicit drugs 3. Their current medications and supplements 4. The patient's functional ability including ADL's, fall risks, home safety risks and hearing or visual             impairment. 5. Diet and physical activities 6. Evidence for depression or mood disorders 7. Reviewed Updated provider list, see scanned forms and [facility] Snapshot.  8. Reviewed whether or not the patient has HCPOA or living will, and discussed what this means with the patient.  Recommended she bring in a  copy for his chart in [facility].  The patients weight, height, BMI and visual acuity have been recorded in the chart I have made referrals, counseling and provided education to the patient based review of the above and I have provided the pt with a written personalized care plan for preventive services.  I have provided the patient with a copy of your personalized plan for preventive services. Instructed to take the time to review along with their updated medication list.  Disposition: No follow-ups on file.  Future Appointments  Date Time Provider Department Center  10/13/2022 10:40 AM Hannah Beat, MD [facility]  01/20/2023 11:15 AM Glean Salvo, NP [facility] None    No orders of the defined types were placed in this encounter.  There are no discontinued medications. No orders of the defined types were placed in this encounter.   Signed,  Elpidio Galea. Marki Frede, MD   Allergies as of 10/13/2022   Not on File      Medication List        Accurate as of October 11, 2022  8:28 AM. If you have any questions, ask your nurse or doctor.          Accu-Chek FastClix Lancets Misc Use to check blood sugar once daily.  Dx: E11.9   cyanocobalamin 1000 MCG tablet Commonly known as: VITAMIN B12 Take 1,000 mcg by mouth daily.   donepezil 10 MG tablet Commonly known as: ARICEPT Take 1 tablet (10 mg total) by mouth at bedtime.   levothyroxine 75 MCG tablet Commonly known as: SYNTHROID Take 1 tablet by mouth daily before breakfast (dose change- deactivated )   memantine 10 MG tablet Commonly known as: NAMENDA Take 1 tablet (10 mg total) by mouth 2 (two) times daily.   simvastatin 20 MG tablet Commonly known as: ZOCOR Take 1 tablet by mouth every evening

## 2022-10-13 ENCOUNTER — Encounter: Payer: Self-pay | Admitting: Family Medicine

## 2022-10-13 ENCOUNTER — Ambulatory Visit (INDEPENDENT_AMBULATORY_CARE_PROVIDER_SITE_OTHER): Payer: PPO | Admitting: Family Medicine

## 2022-10-13 VITALS — BP 140/80 | HR 92 | Temp 97.6°F | Ht 64.5 in | Wt 132.5 lb

## 2022-10-13 DIAGNOSIS — Z0000 Encounter for general adult medical examination without abnormal findings: Secondary | ICD-10-CM | POA: Diagnosis not present

## 2022-10-13 DIAGNOSIS — E038 Other specified hypothyroidism: Secondary | ICD-10-CM | POA: Diagnosis not present

## 2022-10-13 MED ORDER — LEVOTHYROXINE SODIUM 88 MCG PO TABS: ORAL_TABLET | ORAL | 3 refills | Status: DC

## 2022-12-04 ENCOUNTER — Other Ambulatory Visit: Payer: Self-pay | Admitting: Family Medicine

## 2022-12-04 MED ORDER — SIMVASTATIN 20 MG PO TABS: ORAL_TABLET | ORAL | 3 refills | Status: DC

## 2022-12-16 ENCOUNTER — Encounter: Payer: Self-pay | Admitting: Family Medicine

## 2022-12-29 ENCOUNTER — Other Ambulatory Visit: Payer: Self-pay | Admitting: Family Medicine

## 2022-12-29 MED ORDER — MEMANTINE HCL 10 MG PO TABS: 10.0000 mg | ORAL_TABLET | Freq: Two times a day (BID) | ORAL | 3 refills | Status: DC

## 2022-12-29 NOTE — Telephone Encounter (Signed)
LAST APPOINTMENT DATE: 10/13/22   NEXT APPOINTMENT DATE: Visit date not found    LAST REFILL: 12/24/21  QTY: #180 3 rf

## 2023-01-08 ENCOUNTER — Encounter: Payer: Self-pay | Admitting: Family Medicine

## 2023-01-16 MED ORDER — MEMANTINE HCL 10 MG PO TABS: 10.0000 mg | ORAL_TABLET | Freq: Two times a day (BID) | ORAL | 3 refills | Status: DC

## 2023-01-19 NOTE — Progress Notes (Unsigned)
Patient: Kim Cole Date of Birth: [birth year]  Reason for Visit: Follow up History from: Patient Primary Neurologist: [facility]   ASSESSMENT AND PLAN 77 y.o. year old female   1.  Dementia 2.  Gait abnormality 3.  Resting tremor right hand  -Symptoms of parkinsonism (mild masking of the face, right resting hand tremor, right arm mild to moderate bradykinesia, shuffling gait) -Trial Sinemet 25/100 mg 3 times daily, will start with 1/2 tablet 3 times daily for 1 month to minimize side effect -Continue Aricept 10 mg daily, Namenda 10 mg twice daily -We discussed PT, wants to hold off until see benefit of Sinemet -Recommend continued close supervision, ambulation safety for fall prevention -Follow-up in 4 months or sooner if needed  HISTORY  Kim Cole is a 77 year old female, seen in request by her primary care physician Dr. Hannah Beat, for evaluation of memory loss, she is accompanied by her son [name] at today's clinical visit February 07, 2020.   I reviewed and summarized the referring note. PMHx. Hyperlipidemia Hypothyrodism Right hip replacement.   She retired from [employer] at age 40, prior to retirement, she was Environmental health practitioner, did have family history of memory loss, her mother suffered dementia in her 64s   She now lives at home, was noted to have gradual onset of memory loss over the past couple years, especially since 2020, she has difficulty keeping up her pills, quit driving at the beginning of 2021, she reported sleeping well, has good appetite, she had left hip pain, complains of gait abnormality.   UPDATE June 10 2021: She is with her daughter in law at today's visit, overall doing well, worsening memory loss, MoCA examination 9/30, she enjoys watch TV, has good appetite, left hip pain, has difficulty walking  I personally reviewed MRI of the brain in December 2021, mild atrophy no acute abnormality  Laboratory evaluation  previously showed B12 deficiency, level was only 123, after supplement, repeat B12 was elevated, otherwise normal CMP, ESR, C-reactive protein, negative RPR  She has good social support son visit her almost daily, so is her sister, gait abnormality is mainly due to left hip pain, she was noted to have gradual worsening right hand tremor, but denies difficulty sleeping, no hallucinations  Update January 20, 2023 SS: Lives alone, her son [name] comes and goes, he stays there most nights. Her sister helps. Needs reminding to take medication, does her own ADLs. Doesn't drive. Maintains light housework, [name] brings meals/frozen, microwave. Sleeps well. Uses cane, left hip bothers her. No falls. Noted tremor to right hand.  Remains on Aricept and Namenda, having issues with mail order pharmacy, delivering late. On B12 supplement. Repeats herself, tells me several times about her left hip.   REVIEW OF SYSTEMS: Out of a complete 14 system review of symptoms, the patient complains only of the following symptoms, and all other reviewed systems are negative.  See HPI  ALLERGIES: Not on File  HOME MEDICATIONS: Outpatient Medications Prior to Visit  Medication Sig Dispense Refill   ACCU-CHEK FASTCLIX LANCETS MISC Use to check blood sugar once daily.  Dx: E11.9 102 each 3   donepezil (ARICEPT) 10 MG tablet Take 1 tablet (10 mg total) by mouth at bedtime. 90 tablet 0   levothyroxine (SYNTHROID) 88 MCG tablet Take 1 tablet by mouth daily before breakfast 90 tablet 3   memantine (NAMENDA) 10 MG tablet Take 1 tablet (10 mg total) by mouth 2 (two) times daily. 180 tablet 3  simvastatin (ZOCOR) 20 MG tablet Take 1 tablet by mouth every evening 90 tablet 3   vitamin B-12 (CYANOCOBALAMIN) 1000 MCG tablet Take 1,000 mcg by mouth daily.     No facility-administered medications prior to visit.    PAST MEDICAL HISTORY: Past Medical History:  Diagnosis Date   Allergic rhinitis due to pollen    Alzheimer's  disease (HCC) 01/28/2020   Diabetes mellitus type 2 in obese 11/17/2013   Hyperlipidemia    Hypertension    Hypothyroid    Migraine    NONE RECENT    PAST SURGICAL HISTORY: Past Surgical History:  Procedure Laterality Date   COLONOSCOPY WITH PROPOFOL N/A 09/10/2015   Procedure: COLONOSCOPY WITH PROPOFOL;  Surgeon: Charolett Bumpers, MD;  Location: [facility];  Service: Endoscopy;  Laterality: N/A;   COLONSCOPY  FEW YRS AGO   2 SMALL POLYPS   TOTAL HIP ARTHROPLASTY Right 2011   Dr. Lequita Halt   TUBAL LIGATION      FAMILY HISTORY: Family History  Problem Relation Age of Onset   Diabetes Mother    Diabetes Father     SOCIAL HISTORY: Social History   Socioeconomic History   Marital status: Widowed    Spouse name: Not on file   Number of children: Not on file   Years of education: Not on file   Highest education level: Not on file  Occupational History   Occupation: retired    Associate Professor: [employer]  Tobacco Use   Smoking status: Never   Smokeless tobacco: Never  Vaping Use   Vaping status: Never Used  Substance and Sexual Activity   Alcohol use: Not Currently    Comment: SELDOM   Drug use: No   Sexual activity: Not Currently  Other Topics Concern   Not on file  Social History Narrative   Worked 40 years for [employer] / [employer]   Married   Children and Grandchildren   Casimiro Needle Zakrzewski's mother   Right Handed   Drinks a soda seldomly   Social Determinants of Health   Financial Resource Strain: Low Risk  (11/05/2018)   Overall Financial Resource Strain (CARDIA)    Difficulty of Paying Living Expenses: Not hard at all  Food Insecurity: No Food Insecurity (11/20/2019)   [facility]   Hunger Vital Sign    Worried About Running Out of Food in the Last Year: Never true    Ran Out of Food in the Last Year: Never true  Transportation Needs: No Transportation Needs (11/05/2018)   PRAPARE -  Administrator, Civil Service (Medical): No    Lack of Transportation (Non-Medical): No  Physical Activity: Inactive (11/05/2018)   Exercise Vital Sign    Days of Exercise per Week: 0 days    Minutes of Exercise per Session: 0 min  Stress: No Stress Concern Present (11/05/2018)   [name] of Occupational Health - Occupational Stress Questionnaire    Feeling of Stress : Not at all  Social Connections: Not on file  Intimate Partner Violence: Not At Risk (11/05/2018)   Humiliation, Afraid, Rape, and Kick questionnaire    Fear of Current or Ex-Partner: No    Emotionally Abused: No    Physically Abused: No    Sexually Abused: No    PHYSICAL EXAM  Vitals:   01/20/23 1059  BP: (!) 161/79  Pulse: 80  Weight: 142 lb 12.8 oz (64.8 kg)  Height: 5\' 3"  (1.6 m)  Body mass index is 25.3 kg/m.  Generalized: Well developed, in no acute distress  Neurological examination  Mentation: Alert, oriented, cooperative, most history is provided by her son.  She does repeat herself.  She is very pleasant, engaged in visit. Cranial nerve II-XII: Pupils were equal round reactive to light. Extraocular movements were full, visual field were full on confrontational test. Facial sensation and strength were normal.  Head turning and shoulder shrug  were normal and symmetric.  Mild masking of the face is seen. Motor: Overall good strength, mild to moderate bradykinesia right upper extremity.  Resting tremor to right hand noted. Sensory: Sensory testing is intact to soft touch on all 4 extremities. No evidence of extinction is noted.  Coordination: Cerebellar testing reveals good finger-nose-finger and heel-to-shin bilaterally.  Gait and station: Has to push off from seated position to stand, gait is forward leaning, shuffles, antalgic to the left hip.  Tremor to the right hand noted. Reflexes: Deep tendon reflexes are symmetric and normal bilaterally.   DIAGNOSTIC DATA (LABS, IMAGING,  TESTING) - I reviewed patient records, labs, notes, testing and imaging myself where available.  Lab Results  Component Value Date   WBC 4.9 10/06/2022   HGB 13.2 10/06/2022   HCT 40.0 10/06/2022   MCV 90.9 10/06/2022   PLT 205.0 10/06/2022      Component Value Date/Time   NA 141 10/06/2022 0852   K 4.0 10/06/2022 0852   CL 105 10/06/2022 0852   CO2 29 10/06/2022 0852   GLUCOSE 98 10/06/2022 0852   BUN 14 10/06/2022 0852   CREATININE 1.13 10/06/2022 0852   CALCIUM 9.5 10/06/2022 0852   PROT 6.1 10/06/2022 0852   ALBUMIN 3.9 10/06/2022 0852   AST 17 10/06/2022 0852   ALT 12 10/06/2022 0852   ALKPHOS 96 10/06/2022 0852   BILITOT 0.8 10/06/2022 0852   GFRNONAA >60 10/06/2009 0530   GFRAA  10/06/2009 0530    >60        The eGFR has been calculated using the MDRD equation. This calculation has not been validated in all clinical situations. eGFR's persistently <60 mL/min signify possible Chronic Kidney Disease.   Lab Results  Component Value Date   CHOL 165 10/06/2022   HDL 68.60 10/06/2022   LDLCALC 74 10/06/2022   TRIG 112.0 10/06/2022   CHOLHDL 2 10/06/2022   Lab Results  Component Value Date   HGBA1C 5.2 10/06/2022   Lab Results  Component Value Date   VITAMINB12 892 10/06/2022   Lab Results  Component Value Date   TSH 25.48 (H) 10/06/2022    Margie Ege, AGNP-C, DNP 01/20/2023, 11:05 AM [facility] [address] [place], [place] [address] [phone]

## 2023-01-20 ENCOUNTER — Encounter: Payer: Self-pay | Admitting: Neurology

## 2023-01-20 ENCOUNTER — Ambulatory Visit: Payer: PPO | Admitting: Neurology

## 2023-01-20 VITALS — BP 161/79 | HR 80 | Ht 63.0 in | Wt 142.8 lb

## 2023-01-20 DIAGNOSIS — R269 Unspecified abnormalities of gait and mobility: Secondary | ICD-10-CM

## 2023-01-20 DIAGNOSIS — G309 Alzheimer's disease, unspecified: Secondary | ICD-10-CM

## 2023-01-20 DIAGNOSIS — F0280 Dementia in other diseases classified elsewhere without behavioral disturbance: Secondary | ICD-10-CM | POA: Diagnosis not present

## 2023-01-20 DIAGNOSIS — G20C Parkinsonism, unspecified: Secondary | ICD-10-CM | POA: Diagnosis not present

## 2023-01-20 MED ORDER — CARBIDOPA-LEVODOPA 25-100 MG PO TABS: 1.0000 | ORAL_TABLET | Freq: Three times a day (TID) | ORAL | 6 refills | Status: DC

## 2023-01-20 MED ORDER — DONEPEZIL HCL 10 MG PO TABS: 10.0000 mg | ORAL_TABLET | Freq: Every day | ORAL | 3 refills | Status: DC

## 2023-01-20 NOTE — Patient Instructions (Signed)
Try Sinemet 25/100 mg take 1/2 tablet 3 times daily for 1 month then take 1 tablet 3 times daily. Watch for stomach upset, can start taking with food to start to minimize stomach upset. Overtime, recommend taking Sinemet 30-60 minutes before a meal.

## 2023-01-27 ENCOUNTER — Encounter: Payer: Self-pay | Admitting: Neurology

## 2023-03-05 ENCOUNTER — Encounter: Payer: Self-pay | Admitting: Neurology

## 2023-03-05 ENCOUNTER — Encounter: Payer: Self-pay | Admitting: Family Medicine

## 2023-03-12 ENCOUNTER — Encounter: Payer: Self-pay | Admitting: Family Medicine

## 2023-04-10 ENCOUNTER — Encounter: Payer: Self-pay | Admitting: Neurology

## 2023-04-22 ENCOUNTER — Encounter: Payer: Self-pay | Admitting: Neurology

## 2023-04-22 ENCOUNTER — Encounter: Payer: Self-pay | Admitting: Family Medicine

## 2023-04-22 ENCOUNTER — Ambulatory Visit: Payer: Self-pay | Admitting: Family Medicine

## 2023-04-22 NOTE — Telephone Encounter (Signed)
  Chief Complaint: patient son on DPR called to report patient c/o headaches and "just not feeling right" Symptoms: c/o headache Monday. No specific c/o other than "just dont feel right". Poor appetite not eating well. Drinking fluids fine. Sleeping more. Son reports "something different each day but at night patient feels fine.  Frequency: Monday  Pertinent Negatives: Patient denies balance issues no severe headache no blurred vision. No dizziness. No N/T no weakness on either side of body Disposition: [] ED /[] Urgent Care (no appt availability in office) / [x] Appointment(In office/virtual)/ []  [place] [facility]/ [] Home Care/ [] Refused Recommended Disposition /[] [place] [facility]/ []  Follow-up with PCP Additional Notes:   Appt scheduled for 04/27/23 with PCP. Son reports patient will only want to see PCP rather than another provider. No earlier appt noted. Please advise if earlier appt can be scheduled. Please advise regarding co pay in chart states $200 copay. Son reports this is incorrect. Recommended to bring in patient 's insurance card for review.  Please advise      Reason for Disposition  [1] MODERATE headache (e.g., interferes with normal activities) AND [2] present > 24 hours AND [3] unexplained  (Exceptions: analgesics not tried, typical migraine, or headache part of viral illness)  Answer Assessment - Initial Assessment Questions 1. LOCATION: "Where does it hurt?"      No location reported per son on DPR  2. ONSET: "When did the headache start?" (Minutes, hours or days)      Monday  3. PATTERN: "Does the pain come and go, or has it been constant since it started?"     Comes and goes  4. SEVERITY: "How bad is the pain?" and "What does it keep you from doing?"  (e.g., Scale 1-10; mild, moderate, or severe)   - MILD (1-3): doesn't interfere with normal activities    - MODERATE (4-7): interferes with normal activities or awakens from sleep    - SEVERE (8-10): excruciating  pain, unable to do any normal activities        Na  5. RECURRENT SYMPTOM: "Have you ever had headaches before?" If Yes, ask: "When was the last time?" and "What happened that time?"      Hx migraines  6. CAUSE: "What do you think is causing the headache?"     unknown 7. MIGRAINE: "Have you been diagnosed with migraine headaches?" If Yes, ask: "Is this headache similar?"      Hx  8. HEAD INJURY: "Has there been any recent injury to the head?"      Na  9. OTHER SYMPTOMS: "Do you have any other symptoms?" (fever, stiff neck, eye pain, sore throat, cold symptoms)     Headaches and changes sx everyday not eating well.  10. PREGNANCY: "Is there any chance you are pregnant?" "When was your last menstrual period?"       na  Protocols used: [facility]

## 2023-04-22 NOTE — Telephone Encounter (Signed)
This RN called pt to triage. Pt did not answer. RN left a message and a callback number.

## 2023-04-26 NOTE — Progress Notes (Unsigned)
   [name] T. Brenlyn Beshara, MD, CAQ Sports Medicine [facility] [address] [place], [address]  Phone: [phone]  FAX: [phone]  Kim Cole - 78 y.o. female  MRN [id number]  Date of Birth: [birth year]  Date: 04/27/2023  PCP: Hannah Beat, MD  Referral: Hannah Beat, MD  No chief complaint on file.  Subjective:   LADA Kim Cole is a 78 y.o. very pleasant female patient with There is no height or weight on file to calculate BMI. who presents with the following:  She is a very pleasant patient, who I have known well for years.  She does have Alzheimer's disease, as well as some parkinsonism, recently seeing neurology in November 2024, and she has been on some various medicines such as Sinemet for parkinsonism.  She also is on Aricept and Namenda for Alzheimer's.  She presents with some ongoing headache, and she has not felt well at the end of last week.    Review of Systems is noted in the HPI, as appropriate  Objective:   There were no vitals taken for this visit.  GEN: No acute distress; alert,appropriate. PULM: Breathing comfortably in no respiratory distress PSYCH: Normally interactive.   Laboratory and Imaging Data:  Assessment and Plan:   ***

## 2023-04-27 ENCOUNTER — Encounter: Payer: Self-pay | Admitting: Family Medicine

## 2023-04-27 ENCOUNTER — Ambulatory Visit (INDEPENDENT_AMBULATORY_CARE_PROVIDER_SITE_OTHER): Payer: PPO | Admitting: Family Medicine

## 2023-04-27 VITALS — BP 150/90 | HR 90 | Temp 97.5°F | Ht 63.0 in | Wt 140.2 lb

## 2023-04-27 DIAGNOSIS — R519 Headache, unspecified: Secondary | ICD-10-CM

## 2023-04-27 DIAGNOSIS — R4182 Altered mental status, unspecified: Secondary | ICD-10-CM

## 2023-04-27 LAB — BASIC METABOLIC PANEL
BUN: 15 mg/dL (ref 6–23)
CO2: 31 meq/L (ref 19–32)
Calcium: 9.6 mg/dL (ref 8.4–10.5)
Chloride: 102 meq/L (ref 96–112)
Creatinine, Ser: 1.19 mg/dL (ref 0.40–1.20)
GFR: 44.48 mL/min — ABNORMAL LOW (ref >60.00)
Glucose, Bld: 133 mg/dL — ABNORMAL HIGH (ref 70–99)
Potassium: 3.3 meq/L — ABNORMAL LOW (ref 3.5–5.1)
Sodium: 143 meq/L (ref 135–145)

## 2023-04-27 LAB — HEPATIC FUNCTION PANEL
ALT: 9 U/L (ref 0–35)
AST: 13 U/L (ref 0–37)
Albumin: 4.3 g/dL (ref 3.5–5.2)
Alkaline Phosphatase: 105 U/L (ref 39–117)
Bilirubin, Direct: 0.2 mg/dL (ref 0.0–0.3)
Total Bilirubin: 1 mg/dL (ref 0.2–1.2)
Total Protein: 6.9 g/dL (ref 6.0–8.3)

## 2023-04-27 LAB — CBC WITH DIFFERENTIAL/PLATELET
Basophils Absolute: 0 10*3/uL (ref 0.0–0.1)
Basophils Relative: 0.5 % (ref 0.0–3.0)
Eosinophils Absolute: 0 10*3/uL (ref 0.0–0.7)
Eosinophils Relative: 0.6 % (ref 0.0–5.0)
HCT: 43.8 % (ref 36.0–46.0)
Hemoglobin: 14.6 g/dL (ref 12.0–15.0)
Lymphocytes Relative: 25.9 % (ref 12.0–46.0)
Lymphs Abs: 1.9 10*3/uL (ref 0.7–4.0)
MCHC: 33.3 g/dL (ref 30.0–36.0)
MCV: 89.1 fL (ref 78.0–100.0)
Monocytes Absolute: 0.4 10*3/uL (ref 0.1–1.0)
Monocytes Relative: 5.5 % (ref 3.0–12.0)
Neutro Abs: 4.9 10*3/uL (ref 1.4–7.7)
Neutrophils Relative %: 67.5 % (ref 43.0–77.0)
Platelets: 215 10*3/uL (ref 150.0–400.0)
RBC: 4.91 Mil/uL (ref 3.87–5.11)
RDW: 16.1 % — ABNORMAL HIGH (ref 11.5–15.5)
WBC: 7.3 10*3/uL (ref 4.0–10.5)

## 2023-04-28 ENCOUNTER — Encounter: Payer: Self-pay | Admitting: Family Medicine

## 2023-05-05 NOTE — Telephone Encounter (Signed)
   Outpatient Encounter Medications as of 04/28/2023  Medication Sig   carbidopa-levodopa (SINEMET IR) 25-100 MG tablet Take 1 tablet by mouth 3 (three) times daily.   donepezil (ARICEPT) 10 MG tablet Take 1 tablet (10 mg total) by mouth at bedtime.   levothyroxine (SYNTHROID) 88 MCG tablet Take 1 tablet by mouth daily before breakfast   memantine (NAMENDA) 10 MG tablet Take 1 tablet (10 mg total) by mouth 2 (two) times daily.   simvastatin (ZOCOR) 20 MG tablet Take 1 tablet by mouth every evening   vitamin B-12 (CYANOCOBALAMIN) 1000 MCG tablet Take 1,000 mcg by mouth daily.   No facility-administered encounter medications on file as of 04/28/2023.

## 2023-05-06 DIAGNOSIS — Z79899 Other long term (current) drug therapy: Secondary | ICD-10-CM | POA: Diagnosis not present

## 2023-05-06 DIAGNOSIS — E119 Type 2 diabetes mellitus without complications: Secondary | ICD-10-CM | POA: Diagnosis not present

## 2023-05-06 DIAGNOSIS — E785 Hyperlipidemia, unspecified: Secondary | ICD-10-CM | POA: Diagnosis not present

## 2023-05-06 DIAGNOSIS — E039 Hypothyroidism, unspecified: Secondary | ICD-10-CM | POA: Diagnosis not present

## 2023-05-06 DIAGNOSIS — R112 Nausea with vomiting, unspecified: Secondary | ICD-10-CM | POA: Diagnosis not present

## 2023-05-06 DIAGNOSIS — N390 Urinary tract infection, site not specified: Secondary | ICD-10-CM | POA: Diagnosis not present

## 2023-05-06 DIAGNOSIS — Z1152 Encounter for screening for COVID-19: Secondary | ICD-10-CM | POA: Diagnosis not present

## 2023-05-06 DIAGNOSIS — I10 Essential (primary) hypertension: Secondary | ICD-10-CM | POA: Diagnosis not present

## 2023-05-07 DIAGNOSIS — R1110 Vomiting, unspecified: Secondary | ICD-10-CM | POA: Diagnosis not present

## 2023-05-13 NOTE — Telephone Encounter (Signed)
Called left message as well as sent my chart to call office to talk with triage nurse.

## 2023-05-14 ENCOUNTER — Encounter: Payer: Self-pay | Admitting: Primary Care

## 2023-05-14 ENCOUNTER — Inpatient Hospital Stay: Payer: PPO | Admitting: Primary Care

## 2023-05-14 ENCOUNTER — Ambulatory Visit: Payer: Self-pay | Admitting: Family Medicine

## 2023-05-14 ENCOUNTER — Ambulatory Visit: Payer: PPO | Admitting: Primary Care

## 2023-05-14 VITALS — BP 120/70 | HR 77 | Temp 97.2°F | Ht 63.0 in | Wt 137.0 lb

## 2023-05-14 DIAGNOSIS — G309 Alzheimer's disease, unspecified: Secondary | ICD-10-CM | POA: Diagnosis not present

## 2023-05-14 DIAGNOSIS — N3000 Acute cystitis without hematuria: Secondary | ICD-10-CM

## 2023-05-14 DIAGNOSIS — E038 Other specified hypothyroidism: Secondary | ICD-10-CM

## 2023-05-14 DIAGNOSIS — F0280 Dementia in other diseases classified elsewhere without behavioral disturbance: Secondary | ICD-10-CM | POA: Diagnosis not present

## 2023-05-14 LAB — POC URINALSYSI DIPSTICK (AUTOMATED)
Bilirubin, UA: NEGATIVE
Blood, UA: NEGATIVE
Glucose, UA: NEGATIVE
Ketones, UA: POSITIVE
Nitrite, UA: NEGATIVE
Protein, UA: POSITIVE — AB
Spec Grav, UA: 1.02 (ref 1.010–1.025)
Urobilinogen, UA: 0.2 U/dL
pH, UA: 5.5 (ref 5.0–8.0)

## 2023-05-14 MED ORDER — LEVOTHYROXINE SODIUM 100 MCG PO TABS: 100.0000 ug | ORAL_TABLET | Freq: Every day | ORAL | 0 refills | Status: DC

## 2023-05-14 NOTE — Telephone Encounter (Signed)
Copied from CRM [id number]. Topic: Clinical - Red Word Triage >> May 14, 2023 11:46 AM Drema Balzarine wrote: Red Word that prompted transfer to Nurse Triage: Dr. Jenness [place] office asked for patient to be triaged via MyChart message from son, Jeannett Senior.  "[facility] last Tuesday night. They said she had a bad uti.. she has been on antibiotics for a week now. It is hard for me to get her to eat hardly anything.  She tells me nothing tastes good ND then she will say she doesn't feel good but she is not vomiting or having diarrhea that I'm am aware of.. she will eat certain things and then later in the day she doesn't want to do anything but lay in bed and say she doesn't feel good but can't tell me what's wrong.. she gets agitated when I tell her if she doesn't eat she's not going to feel better. I am worried that maybe she needs more meds for infection or a follow up with you.. can you advise me on the best thing to do ?"  Chief Complaint: urinary tract infection not getting better Symptoms: states not feeling well currently on antibiotics Frequency: constant Pertinent Negatives: Patient denies fever, n/v/d Disposition: [x] ED /[] Urgent Care (no appt availability in office) / [] Appointment(In office/virtual)/ []  [place] [facility]/ [] Home Care/ [] Refused Recommended Disposition /[] [place] [facility]/ []  Follow-up with PCP Additional Notes: Son declined apts available today and tomorrow.  Instructed to go to ER.  PCP office updated.   Reason for Disposition  Patient sounds very sick or weak to the triager  Answer Assessment - Initial Assessment Questions 1. SYMPTOM: "What's the main symptom you're concerned about?" (e.g., frequency, incontinence)     Is currently being treated for UTI 2. ONSET: "When did the  UTI  start?"     Last Tuesday night 3. PAIN: "Is there any pain?" If Yes, ask: "How bad is it?" (Scale: 1-10; mild, moderate, severe)     "Just don't feel  good" 4. CAUSE: "What do you think is causing the symptoms?"     na 5. OTHER SYMPTOMS: "Do you have any other symptoms?" (e.g., blood in urine, fever, flank pain, pain with urination)     Eating very little, sleeping,  6. PREGNANCY: "Is there any chance you are pregnant?" "When was your last menstrual period?"     na  Protocols used: Urinary Symptoms-A-AH

## 2023-05-14 NOTE — Assessment & Plan Note (Signed)
Uncontrolled for quite some time but could be secondary to incorrect dosing versus in a incorrect demonstration. We also discussed that she could be at the appropriate dose of levothyroxine at 88 mcg, but incorrect administration can be contributing.  Discussed today with patient some the correct instructions for administration of levothyroxine.  We discussed to wait at least 30 minutes before food and other medications and to separate vitamins at least 4 hours from levothyroxine.  Given the significant increase in TSH levels, coupled with mental status changes, will increase levothyroxine to 100 mcg daily with plans to recheck thyroid studies again in 6 weeks. Stressed the importance of repeat lab testing and close follow up with PCP.

## 2023-05-14 NOTE — Assessment & Plan Note (Addendum)
Declining.  This could be multifactorial with uncontrolled hypothyroidism, recent cystitis, progression of Alzheimer's dementia. Discussed with family today.  Follow-up with neurologist as scheduled in March. Will work to improve thyroid function. Repeat urinalysis and urine culture pending.  Emergency department notes and labs reviewed through care everywhere from February 2025.

## 2023-05-14 NOTE — Telephone Encounter (Signed)
Kim Cole was seen by K. Clark in the office today 05/14/2023.

## 2023-05-14 NOTE — Patient Instructions (Signed)
We increased the dose of your thyroid medication, levothyroxine, to 100 mcg daily.  I sent a prescription to [facility] pharmacy.  Be sure to take your levothyroxine (thyroid medication) every morning on an empty stomach with water only.   No food or other medications for 30 minutes.   No heartburn medication, iron pills, calcium, vitamin D, or magnesium pills within four hours of taking levothyroxine.   Please schedule a lab only appointment for 6 weeks to repeat the thyroid test.  Follow-up with the neurologist as scheduled.  We will be in touch again with the urine test results.  It was a pleasure meeting you!

## 2023-05-14 NOTE — Progress Notes (Signed)
Subjective:    Patient ID: Kim Cole, female    DOB: 05-May-1945, 78 y.o.   MRN: [id number]  Urinary Tract Infection  Pertinent negatives include no nausea or vomiting.    Kim Cole is a very pleasant 78 y.o. female patient of Dr. Patsy Lager with a history of hypertension, migraines, type 2 diabetes, Alzheimer's Disease, hypothyroidism, Parkinsonism who presents today with her son for memory changes/confusion.  Her son joins [name] today who provides information for HPI. She is non contributory given her Alzheimer's.  History of Alzheimer's dementia and is managed on Namenda 10 mg twice daily, Aricept 10 mg daily.  Also with Parkinson's disease and is managed on carbidopa levodopa 25-100 mg.  Evaluated at [facility] ED on 05/07/2023 for symptoms of nausea, vomiting, generalized bodyaches, increased confusion over the last several months but worsened over the last few days.  During her stay in the ED labs revealed TSH of 51, free T4 of 1.37, hyperglycemia, mildly elevated lipase and alkaline phosphatase.  Urinalysis was suspicious for infection so she was treated with cephalexin 500 mg twice daily x 7 days.  Urine culture returned with Streptococcus anginosus, 50,000-100,000 cultures.  She was discharged home later that morning.  Since her ED visit she continues to experience increased confusion which is not typical. Her behavior has significantly changed. She is more agitated, starts and stops meals without finishing, not wanting to care for herself such as bathing, decline in memory.  She is managed on levothyroxine 88 mcg tablets daily.  Last TSH was collected in July 2024 with level of 25.48, levothyroxine was increased to 88 mcg with recommendations for repeat thyroid studies 6 weeks later. This was not completed.  Previously managed on levothyroxine 75 mcg tablets, last normal TSH was 0.82 in September 2021.  Her son is giving her levothyroxine along with breakfast and with  her other medications including B12.   She does follow with neurology for Alzheimer's disease, has an appointment scheduled for March 5.  Review of Systems  Constitutional:  Negative for fever.  Gastrointestinal:  Negative for abdominal pain, nausea and vomiting.         Past Medical History:  Diagnosis Date   Allergic rhinitis due to pollen    Alzheimer's disease (HCC) 01/28/2020   Diabetes mellitus type 2 in obese 11/17/2013   Hyperlipidemia    Hypertension    Hypothyroid    Migraine    NONE RECENT    Social History   Socioeconomic History   Marital status: Widowed    Spouse name: Not on file   Number of children: Not on file   Years of education: Not on file   Highest education level: Not on file  Occupational History   Occupation: retired    Associate Professor: [employer]  Tobacco Use   Smoking status: Never   Smokeless tobacco: Never  Vaping Use   Vaping status: Never Used  Substance and Sexual Activity   Alcohol use: Not Currently    Comment: SELDOM   Drug use: No   Sexual activity: Not Currently  Other Topics Concern   Not on file  Social History Narrative   Worked 40 years for [employer] / [employer]   Married   Children and Grandchildren   Casimiro Needle [employer]'s mother   Right Handed   Drinks a soda seldomly   Social Drivers of Health   Financial Resource Strain: Low Risk  (11/05/2018)   Overall Financial Resource Strain (CARDIA)  Difficulty of Paying Living Expenses: Not hard at all  Food Insecurity: No Food Insecurity (11/20/2019)   Received from [facility], [facility]   Hunger Vital Sign    Worried About Running Out of Food in the Last Year: Never true    Ran Out of Food in the Last Year: Never true  Transportation Needs: No Transportation Needs (11/05/2018)   PRAPARE - Administrator, Civil Service (Medical): No    Lack of Transportation (Non-Medical): No  Physical Activity: Inactive  (11/05/2018)   Exercise Vital Sign    Days of Exercise per Week: 0 days    Minutes of Exercise per Session: 0 min  Stress: No Stress Concern Present (11/05/2018)   [name] of Occupational Health - Occupational Stress Questionnaire    Feeling of Stress : Not at all  Social Connections: Not on file  Intimate Partner Violence: Not At Risk (11/05/2018)   Humiliation, Afraid, Rape, and Kick questionnaire    Fear of Current or Ex-Partner: No    Emotionally Abused: No    Physically Abused: No    Sexually Abused: No    Past Surgical History:  Procedure Laterality Date   COLONOSCOPY WITH PROPOFOL N/A 09/10/2015   Procedure: COLONOSCOPY WITH PROPOFOL;  Surgeon: Charolett Bumpers, MD;  Location: [facility] ENDOSCOPY;  Service: Endoscopy;  Laterality: N/A;   COLONSCOPY  FEW YRS AGO   2 SMALL POLYPS   TOTAL HIP ARTHROPLASTY Right 2011   Dr. Lequita Halt   TUBAL LIGATION      Family History  Problem Relation Age of Onset   Diabetes Mother    Diabetes Father     Not on File  Current Outpatient Medications on File Prior to Visit  Medication Sig Dispense Refill   carbidopa-levodopa (SINEMET IR) 25-100 MG tablet Take 1 tablet by mouth 3 (three) times daily. 90 tablet 6   donepezil (ARICEPT) 10 MG tablet Take 1 tablet (10 mg total) by mouth at bedtime. 90 tablet 3   memantine (NAMENDA) 10 MG tablet Take 1 tablet (10 mg total) by mouth 2 (two) times daily. 180 tablet 3   simvastatin (ZOCOR) 20 MG tablet Take 1 tablet by mouth every evening 90 tablet 3   vitamin B-12 (CYANOCOBALAMIN) 1000 MCG tablet Take 1,000 mcg by mouth daily.     No current facility-administered medications on file prior to visit.    BP 120/70 (BP Location: Left Arm, Patient Position: Sitting, Cuff Size: Normal)   Pulse 77   Temp (!) 97.2 F (36.2 C) (Temporal)   Ht 5\' 3"  (1.6 m)   Wt 137 lb (62.1 kg)   SpO2 98%   BMI 24.27 kg/m  Objective:   Physical Exam Cardiovascular:     Rate and Rhythm: Normal rate and regular  rhythm.  Pulmonary:     Effort: Pulmonary effort is normal.     Breath sounds: Normal breath sounds.  Skin:    General: Skin is warm and dry.  Neurological:     Mental Status: She is alert.     Comments: Follow some commands.  Unable to participate in HPI.  Repetitive questioning.           Assessment & Plan:  Other specified hypothyroidism Assessment & Plan: Uncontrolled for quite some time but could be secondary to incorrect dosing versus in a incorrect demonstration. We also discussed that she could be at the appropriate dose of levothyroxine at 88 mcg, but incorrect administration can be contributing.  Discussed today with patient some the correct instructions for administration of levothyroxine.  We discussed to wait at least 30 minutes before food and other medications and to separate vitamins at least 4 hours from levothyroxine.  Given the significant increase in TSH levels, coupled with mental status changes, will increase levothyroxine to 100 mcg daily with plans to recheck thyroid studies again in 6 weeks. Stressed the importance of repeat lab testing and close follow up with PCP.      Orders: -     Levothyroxine Sodium; Take 1 tablet (100 mcg total) by mouth daily. Take 1 tablet by mouth every morning on an empty stomach with water only.  No food or other medications for 30 minutes. No vitamins for 4 hours or later.  Dispense: 90 tablet; Refill: 0 -     TSH; Future  Acute cystitis without hematuria -     POCT Urinalysis Dipstick (Automated) -     Urine Culture  Alzheimer's disease [facility]) Assessment & Plan: Declining.  This could be multifactorial with uncontrolled hypothyroidism, recent cystitis, progression of Alzheimer's dementia. Discussed with family today.  Follow-up with neurologist as scheduled in March. Will work to improve thyroid function. Repeat urinalysis and urine culture pending.  Emergency department notes and labs reviewed through care  everywhere from February 2025.         Doreene Nest, NP

## 2023-05-15 LAB — URINE CULTURE
MICRO NUMBER:: 16108686
Result:: NO GROWTH
SPECIMEN QUALITY:: ADEQUATE

## 2023-05-26 NOTE — Progress Notes (Deleted)
 Patient: Kim Cole Date of Birth: 01/12/1946  Reason for Visit: Follow up History from: Patient Primary Neurologist: Kim Cole   ASSESSMENT AND PLAN 78 y.o. year old female   1.  Dementia 2.  Gait abnormality 3.  Resting tremor right hand  -Symptoms of parkinsonism (mild masking of the face, right resting hand tremor, right arm mild to moderate bradykinesia, shuffling gait) -Trial Sinemet 25/100 mg 3 times daily, will start with 1/2 tablet 3 times daily for 1 month to minimize side effect -Continue Aricept 10 mg daily, Namenda 10 mg twice daily -We discussed PT, wants to hold off until see benefit of Sinemet -Recommend continued close supervision, ambulation safety for fall prevention -Follow-up in 4 months or sooner if needed  HISTORY  CORNELL Cole is a 78 year old female, seen in request by her primary care physician Dr. Hannah Cole, for evaluation of memory loss, she is accompanied by her son Kim Cole at today's clinical visit February 07, 2020.   I reviewed and summarized the referring note. PMHx. Hyperlipidemia Hypothyrodism Right hip replacement.   She retired from [employer] at age 57, prior to retirement, she was Environmental health practitioner, did have family history of memory loss, her mother suffered dementia in her 55s   She now lives at home, was noted to have gradual onset of memory loss over the past couple years, especially since 2020, she has difficulty keeping up her pills, quit driving at the beginning of 2021, she reported sleeping well, has good appetite, she had left hip pain, complains of gait abnormality.   UPDATE June 10 2021: She is with her daughter in law at today's visit, overall doing well, worsening memory loss, MoCA examination 9/30, she enjoys watch TV, has good appetite, left hip pain, has difficulty walking  I personally reviewed MRI of the brain in December 2021, mild atrophy no acute abnormality  Laboratory evaluation  previously showed B12 deficiency, level was only 123, after supplement, repeat B12 was elevated, otherwise normal CMP, ESR, C-reactive protein, negative RPR  She has good social support son visit her almost daily, so is her sister, gait abnormality is mainly due to left hip pain, she was noted to have gradual worsening right hand tremor, but denies difficulty sleeping, no hallucinations  Update January 20, 2023 SS: Lives alone, her son Kim Cole comes and goes, he stays there most nights. Her sister helps. Needs reminding to take medication, does her own ADLs. Doesn't drive. Maintains light housework, Kim Cole brings meals/frozen, microwave. Sleeps well. Uses cane, left hip bothers her. No falls. Noted tremor to right hand.  Remains on Aricept and Namenda, having issues with mail order pharmacy, delivering late. On B12 supplement. Repeats herself, tells me several times about her left hip.   Update May 27, 2023 SS:   REVIEW OF SYSTEMS: Out of a complete 14 system review of symptoms, the patient complains only of the following symptoms, and all other reviewed systems are negative.  See HPI  ALLERGIES: Not on File  HOME MEDICATIONS: Outpatient Medications Prior to Visit  Medication Sig Dispense Refill   carbidopa-levodopa (SINEMET IR) 25-100 MG tablet Take 1 tablet by mouth 3 (three) times daily. 90 tablet 6   donepezil (ARICEPT) 10 MG tablet Take 1 tablet (10 mg total) by mouth at bedtime. 90 tablet 3   levothyroxine (SYNTHROID) 100 MCG tablet Take 1 tablet (100 mcg total) by mouth daily. Take 1 tablet by mouth every morning on an empty stomach with water only.  No food  or other medications for 30 minutes. No vitamins for 4 hours or later. 90 tablet 0   memantine (NAMENDA) 10 MG tablet Take 1 tablet (10 mg total) by mouth 2 (two) times daily. 180 tablet 3   simvastatin (ZOCOR) 20 MG tablet Take 1 tablet by mouth every evening 90 tablet 3   vitamin B-12 (CYANOCOBALAMIN) 1000 MCG tablet Take 1,000 mcg  by mouth daily.     No facility-administered medications prior to visit.    PAST MEDICAL HISTORY: Past Medical History:  Diagnosis Date   Allergic rhinitis due to pollen    Alzheimer's disease (HCC) 01/28/2020   Diabetes mellitus type 2 in obese 11/17/2013   Hyperlipidemia    Hypertension    Hypothyroid    Migraine    NONE RECENT    PAST SURGICAL HISTORY: Past Surgical History:  Procedure Laterality Date   COLONOSCOPY WITH PROPOFOL N/A 09/10/2015   Procedure: COLONOSCOPY WITH PROPOFOL;  Surgeon: Charolett Bumpers, MD;  Location: [facility];  Service: Endoscopy;  Laterality: N/A;   COLONSCOPY  FEW YRS AGO   2 SMALL POLYPS   TOTAL HIP ARTHROPLASTY Right 2011   Dr. Lequita Halt   TUBAL LIGATION      FAMILY HISTORY: Family History  Problem Relation Age of Onset   Diabetes Mother    Diabetes Father     SOCIAL HISTORY: Social History   Socioeconomic History   Marital status: Widowed    Spouse name: Not on file   Number of children: Not on file   Years of education: Not on file   Highest education level: Not on file  Occupational History   Occupation: retired    Associate Professor: [employer]  Tobacco Use   Smoking status: Never   Smokeless tobacco: Never  Vaping Use   Vaping status: Never Used  Substance and Sexual Activity   Alcohol use: Not Currently    Comment: SELDOM   Drug use: No   Sexual activity: Not Currently  Other Topics Concern   Not on file  Social History Narrative   Worked 40 years for [employer] / [employer]   Married   Children and Grandchildren   Kim Cole's mother   Right Handed   Drinks a soda seldomly   Social Drivers of Health   Financial Resource Strain: Low Risk  (11/05/2018)   Overall Financial Resource Strain (CARDIA)    Difficulty of Paying Living Expenses: Not hard at all  Food Insecurity: No Food Insecurity (11/20/2019)   Received from [facility], [facility]   Hunger Vital Sign     Worried About Running Out of Food in the Last Year: Never true    Ran Out of Food in the Last Year: Never true  Transportation Needs: No Transportation Needs (11/05/2018)   PRAPARE - Administrator, Civil Service (Medical): No    Lack of Transportation (Non-Medical): No  Physical Activity: Inactive (11/05/2018)   Exercise Vital Sign    Days of Exercise per Week: 0 days    Minutes of Exercise per Session: 0 min  Stress: No Stress Concern Present (11/05/2018)   [name] of Occupational Health - Occupational Stress Questionnaire    Feeling of Stress : Not at all  Social Connections: Not on file  Intimate Partner Violence: Not At Risk (11/05/2018)   Humiliation, Afraid, Rape, and Kick questionnaire    Fear of Current or Ex-Partner: No    Emotionally Abused: No    Physically Abused:  No    Sexually Abused: No    PHYSICAL EXAM  There were no vitals filed for this visit.  There is no height or weight on file to calculate BMI.  Generalized: Well developed, in no acute distress  Neurological examination  Mentation: Alert, oriented, cooperative, most history is provided by her son.  She does repeat herself.  She is very pleasant, engaged in visit. Cranial nerve II-XII: Pupils were equal round reactive to light. Extraocular movements were full, visual field were full on confrontational test. Facial sensation and strength were normal.  Head turning and shoulder shrug  were normal and symmetric.  Mild masking of the face is seen. Motor: Overall good strength, mild to moderate bradykinesia right upper extremity.  Resting tremor to right hand noted. Sensory: Sensory testing is intact to soft touch on all 4 extremities. No evidence of extinction is noted.  Coordination: Cerebellar testing reveals good finger-nose-finger and heel-to-shin bilaterally.  Gait and station: Has to push off from seated position to stand, gait is forward leaning, shuffles, antalgic to the left hip.  Tremor  to the right hand noted. Reflexes: Deep tendon reflexes are symmetric and normal bilaterally.   DIAGNOSTIC DATA (LABS, IMAGING, TESTING) - I reviewed patient records, labs, notes, testing and imaging myself where available.  Lab Results  Component Value Date   WBC 7.3 04/27/2023   HGB 14.6 04/27/2023   HCT 43.8 04/27/2023   MCV 89.1 04/27/2023   PLT 215.0 04/27/2023      Component Value Date/Time   NA 143 04/27/2023 0933   K 3.3 (L) 04/27/2023 0933   CL 102 04/27/2023 0933   CO2 31 04/27/2023 0933   GLUCOSE 133 (H) 04/27/2023 0933   BUN 15 04/27/2023 0933   CREATININE 1.19 04/27/2023 0933   CALCIUM 9.6 04/27/2023 0933   PROT 6.9 04/27/2023 0933   ALBUMIN 4.3 04/27/2023 0933   AST 13 04/27/2023 0933   ALT 9 04/27/2023 0933   ALKPHOS 105 04/27/2023 0933   BILITOT 1.0 04/27/2023 0933   GFRNONAA >60 10/06/2009 0530   GFRAA  10/06/2009 0530    >60        The eGFR has been calculated using the MDRD equation. This calculation has not been validated in all clinical situations. eGFR's persistently <60 mL/min signify possible Chronic Kidney Disease.   Lab Results  Component Value Date   CHOL 165 10/06/2022   HDL 68.60 10/06/2022   LDLCALC 74 10/06/2022   TRIG 112.0 10/06/2022   CHOLHDL 2 10/06/2022   Lab Results  Component Value Date   HGBA1C 5.2 10/06/2022   Lab Results  Component Value Date   VITAMINB12 892 10/06/2022   Lab Results  Component Value Date   TSH 25.48 (H) 10/06/2022    Margie Ege, AGNP-C, DNP 05/26/2023, 9:07 PM [facility] [address], [address] [place], [place] [address] [phone]

## 2023-05-27 ENCOUNTER — Encounter: Payer: Self-pay | Admitting: Neurology

## 2023-05-27 ENCOUNTER — Ambulatory Visit: Payer: PPO | Admitting: Neurology

## 2023-06-04 NOTE — Progress Notes (Unsigned)
 Patient: Kim Cole Date of Birth: 1945-05-24  Reason for Visit: Follow up History from: Patient, son Casimiro Needle, we called other son [name] Primary Neurologist: Terrace Arabia  ASSESSMENT AND PLAN 78 y.o. year old female   1.  Dementia 2.  Gait abnormality 3.  Resting tremor right hand 4.  Parkinsonism  -We will continue Sinemet 25/100 mg 3 times daily, after discussion with son feels less gait shuffling.  On exam mild masking of the face, resting right hand tremor, right arm mild to moderate bradykinesia, shuffling gait -For now, continue Aricept 10 mg daily, Namenda 10 mg twice daily.  Her memory is progressing.  Unclear benefit of these medications.  May consider reevaluation in the future to continue on these -Recommend increased supervision at home, her dementia is progressing -Recommend brain stimulating activity, management of vascular risk factors, exercise as tolerated -Follow-up in 6 months with Dr. Terrace Arabia to evaluate if we should continue Sinemet, Aricept, Namenda  HISTORY  Kim Cole is a 78 year old female, seen in request by her primary care physician Dr. Hannah Beat, for evaluation of memory loss, she is accompanied by her son [name] at today's clinical visit February 07, 2020.   I reviewed and summarized the referring note. PMHx. Hyperlipidemia Hypothyrodism Right hip replacement.   She retired from [employer] at age 10, prior to retirement, she was Environmental health practitioner, did have family history of memory loss, her mother suffered dementia in her 36s   She now lives at home, was noted to have gradual onset of memory loss over the past couple years, especially since 2020, she has difficulty keeping up her pills, quit driving at the beginning of 2021, she reported sleeping well, has good appetite, she had left hip pain, complains of gait abnormality.   UPDATE June 10 2021: She is with her daughter in law at today's visit, overall doing well, worsening  memory loss, MoCA examination 9/30, she enjoys watch TV, has good appetite, left hip pain, has difficulty walking  I personally reviewed MRI of the brain in December 2021, mild atrophy no acute abnormality  Laboratory evaluation previously showed B12 deficiency, level was only 123, after supplement, repeat B12 was elevated, otherwise normal CMP, ESR, C-reactive protein, negative RPR  She has good social support son visit her almost daily, so is her sister, gait abnormality is mainly due to left hip pain, she was noted to have gradual worsening right hand tremor, but denies difficulty sleeping, no hallucinations  Update January 20, 2023 SS: Lives alone, her son [name] comes and goes, he stays there most nights. Her sister helps. Needs reminding to take medication, does her own ADLs. Doesn't drive. Maintains light housework, [name] brings meals/frozen, microwave. Sleeps well. Uses cane, left hip bothers her. No falls. Noted tremor to right hand.  Remains on Aricept and Namenda, having issues with mail order pharmacy, delivering late. On B12 supplement. Repeats herself, tells me several times about her left hip.   Update June 05, 2023 SS: Here with her son, Casimiro Needle. She doesn't remember to take her medications. Other brother, [name] is there most of the time. Aide coming in the morning, to help cook, take her meds. She doesn't cook. We started Sinemet 25/100 mg TID, they think she is taking. Sedentary, watches TV, sleeps in the chair. Sleeps well at night, steve spends the night. Memory is declining. Mood is fairly good. Often complains of not feeling well, nonspecific, " I am old". Left hip hurts her. We called his other  son, [name], thinks she is shuffling less. She repeats herself.   REVIEW OF SYSTEMS: Out of a complete 14 system review of symptoms, the patient complains only of the following symptoms, and all other reviewed systems are negative.  See HPI  ALLERGIES: Not on File  HOME  MEDICATIONS: Outpatient Medications Prior to Visit  Medication Sig Dispense Refill   carbidopa-levodopa (SINEMET IR) 25-100 MG tablet Take 1 tablet by mouth 3 (three) times daily. 90 tablet 6   donepezil (ARICEPT) 10 MG tablet Take 1 tablet (10 mg total) by mouth at bedtime. 90 tablet 3   levothyroxine (SYNTHROID) 100 MCG tablet Take 1 tablet (100 mcg total) by mouth daily. Take 1 tablet by mouth every morning on an empty stomach with water only.  No food or other medications for 30 minutes. No vitamins for 4 hours or later. 90 tablet 0   memantine (NAMENDA) 10 MG tablet Take 1 tablet (10 mg total) by mouth 2 (two) times daily. 180 tablet 3   simvastatin (ZOCOR) 20 MG tablet Take 1 tablet by mouth every evening 90 tablet 3   vitamin B-12 (CYANOCOBALAMIN) 1000 MCG tablet Take 1,000 mcg by mouth daily.     No facility-administered medications prior to visit.    PAST MEDICAL HISTORY: Past Medical History:  Diagnosis Date   Allergic rhinitis due to pollen    Alzheimer's disease (HCC) 01/28/2020   Diabetes mellitus type 2 in obese 11/17/2013   Hyperlipidemia    Hypertension    Hypothyroid    Migraine    NONE RECENT    PAST SURGICAL HISTORY: Past Surgical History:  Procedure Laterality Date   COLONOSCOPY WITH PROPOFOL N/A 09/10/2015   Procedure: COLONOSCOPY WITH PROPOFOL;  Surgeon: Charolett Bumpers, MD;  Location: [facility];  Service: Endoscopy;  Laterality: N/A;   COLONSCOPY  FEW YRS AGO   2 SMALL POLYPS   TOTAL HIP ARTHROPLASTY Right 2011   Dr. Lequita Halt   TUBAL LIGATION      FAMILY HISTORY: Family History  Problem Relation Age of Onset   Diabetes Mother    Diabetes Father     SOCIAL HISTORY: Social History   Socioeconomic History   Marital status: Widowed    Spouse name: Not on file   Number of children: Not on file   Years of education: Not on file   Highest education level: Not on file  Occupational History   Occupation: retired    Associate Professor: [employer]  Tobacco Use   Smoking status: Never   Smokeless tobacco: Never  Vaping Use   Vaping status: Never Used  Substance and Sexual Activity   Alcohol use: Not Currently    Comment: SELDOM   Drug use: No   Sexual activity: Not Currently  Other Topics Concern   Not on file  Social History Narrative   Worked 40 years for [employer] / [employer]   Married   Children and Grandchildren   Casimiro Needle Garson's mother   Right Handed   Drinks a soda seldomly   Social Drivers of Health   Financial Resource Strain: Low Risk  (11/05/2018)   Overall Financial Resource Strain (CARDIA)    Difficulty of Paying Living Expenses: Not hard at all  Food Insecurity: No Food Insecurity (11/20/2019)   Received from [facility], [facility]   Hunger Vital Sign    Worried About Running Out of Food in the Last Year: Never true    Ran Out of Food in  the Last Year: Never true  Transportation Needs: No Transportation Needs (11/05/2018)   PRAPARE - Administrator, Civil Service (Medical): No    Lack of Transportation (Non-Medical): No  Physical Activity: Inactive (11/05/2018)   Exercise Vital Sign    Days of Exercise per Week: 0 days    Minutes of Exercise per Session: 0 min  Stress: No Stress Concern Present (11/05/2018)   [name] of Occupational Health - Occupational Stress Questionnaire    Feeling of Stress : Not at all  Social Connections: Not on file  Intimate Partner Violence: Not At Risk (11/05/2018)   Humiliation, Afraid, Rape, and Kick questionnaire    Fear of Current or Ex-Partner: No    Emotionally Abused: No    Physically Abused: No    Sexually Abused: No    PHYSICAL EXAM  Vitals:   06/05/23 1055  BP: 127/70  Pulse: 72  Weight: 130 lb (59 kg)  Height: 5\' 3"  (1.6 m)    Body mass index is 23.03 kg/m.    06/10/2021    9:15 AM  Montreal Cognitive Assessment   Visuospatial/ Executive (0/5) 0  Naming (0/3) 2  Attention:  Read list of digits (0/2) 1  Attention: Read list of letters (0/1) 1  Attention: Serial 7 subtraction starting at 100 (0/3) 0  Language: Repeat phrase (0/2) 2  Language : Fluency (0/1) 0  Abstraction (0/2) 1  Delayed Recall (0/5) 0  Orientation (0/6) 1  Total 8  Adjusted Score (based on education) 9   Generalized: Well developed, in no acute distress, elderly female Neurological examination  Mentation: Alert, oriented, cooperative, most history is provided by her son.  She repeats herself often.  Relies on her son for history. Unhappy look on her face. Cranial nerve II-XII: Pupils were equal round reactive to light. Extraocular movements were full, visual field were full on confrontational test. Facial sensation and strength were normal.  Head turning and shoulder shrug  were normal and symmetric.  Mild masking of the face is seen. Motor: Overall good strength, mild to moderate bradykinesia right upper extremity.  Resting tremor to right hand noted. Sensory: Sensory testing is intact to soft touch on all 4 extremities. No evidence of extinction is noted.  Coordination: Cerebellar testing reveals good finger-nose-finger and heel-to-shin bilaterally.  Gait and station: Has to push off from seated position to stand, gait is forward leaning, shuffle with gait initiation, antalgic to the left hip, en bloc turns.  Tremor to the right hand noted. Uses single point cane Reflexes: Deep tendon reflexes are symmetric and normal bilaterally.   DIAGNOSTIC DATA (LABS, IMAGING, TESTING) - I reviewed patient records, labs, notes, testing and imaging myself where available.  Lab Results  Component Value Date   WBC 7.3 04/27/2023   HGB 14.6 04/27/2023   HCT 43.8 04/27/2023   MCV 89.1 04/27/2023   PLT 215.0 04/27/2023      Component Value Date/Time   NA 143 04/27/2023 0933   K 3.3 (L) 04/27/2023 0933   CL 102 04/27/2023 0933   CO2 31 04/27/2023 0933   GLUCOSE 133 (H) 04/27/2023 0933   BUN 15  04/27/2023 0933   CREATININE 1.19 04/27/2023 0933   CALCIUM 9.6 04/27/2023 0933   PROT 6.9 04/27/2023 0933   ALBUMIN 4.3 04/27/2023 0933   AST 13 04/27/2023 0933   ALT 9 04/27/2023 0933   ALKPHOS 105 04/27/2023 0933   BILITOT 1.0 04/27/2023 0933   GFRNONAA >60 10/06/2009 0530  GFRAA  10/06/2009 0530    >60        The eGFR has been calculated using the MDRD equation. This calculation has not been validated in all clinical situations. eGFR's persistently <60 mL/min signify possible Chronic Kidney Disease.   Lab Results  Component Value Date   CHOL 165 10/06/2022   HDL 68.60 10/06/2022   LDLCALC 74 10/06/2022   TRIG 112.0 10/06/2022   CHOLHDL 2 10/06/2022   Lab Results  Component Value Date   HGBA1C 5.2 10/06/2022   Lab Results  Component Value Date   VITAMINB12 892 10/06/2022   Lab Results  Component Value Date   TSH 25.48 (H) 10/06/2022    Margie Ege, AGNP-C, DNP 06/05/2023, 11:06 AM [facility] [address] [place], [place] [address] [phone]

## 2023-06-05 ENCOUNTER — Encounter: Payer: Self-pay | Admitting: Neurology

## 2023-06-05 ENCOUNTER — Ambulatory Visit: Admitting: Neurology

## 2023-06-05 VITALS — BP 127/70 | HR 72 | Ht 63.0 in | Wt 130.0 lb

## 2023-06-05 DIAGNOSIS — G309 Alzheimer's disease, unspecified: Secondary | ICD-10-CM

## 2023-06-05 DIAGNOSIS — G20C Parkinsonism, unspecified: Secondary | ICD-10-CM | POA: Diagnosis not present

## 2023-06-05 DIAGNOSIS — F0280 Dementia in other diseases classified elsewhere without behavioral disturbance: Secondary | ICD-10-CM | POA: Diagnosis not present

## 2023-06-05 MED ORDER — CARBIDOPA-LEVODOPA 25-100 MG PO TABS: 1.0000 | ORAL_TABLET | Freq: Three times a day (TID) | ORAL | 6 refills | Status: AC

## 2023-06-05 NOTE — Patient Instructions (Signed)
 For now continue Sinemet 1 tablet 3 times daily Continue Aricept and Namenda for memory for now Recommend increased supervision Try to engage in exercise, activity Follow up in 6 months with Dr. Terrace Arabia

## 2023-07-21 NOTE — Progress Notes (Signed)
 Kim Buffalo T. Valen Mascaro, MD, CAQ Sports Medicine [facility] at [facility] [address] [place], [address]  Phone: [phone]  FAX: [phone]  Kim Cole - 78 y.o. female  MRN [id number]  Date of Birth: 17-Jan-1946  Date: 07/23/2023  PCP: Kim Curt, MD  Referral: Kim Curt, MD  Chief Complaint  Patient presents with   Toe Pain    Right Big Toe   Skin Lesion    Left Breast   Subjective:   Kim Cole is a 78 y.o. very pleasant female patient with Body mass index is 21.85 kg/m. who presents with the following:  Ms. Kim Cole is a patient who I have known for many years.  She presents today with family and has some concerns about a spot on her left breast.  She was here with her son Kim Cole, who is also been my patient for many years.  He has painful extensive onychomycosis in bilateral toes.  Took a picture of this for the chart.  She also has a place on the left lateral breast that they are worried about.  She does pick at this some and it is a little bit irritating at times.  This history is really primarily gotten from her son.  She also has some painful toenails.  Seb K:  L breast    Review of Systems is noted in the HPI, as appropriate  Objective:   BP 130/70 (BP Location: Right Arm, Patient Position: Sitting, Cuff Size: Normal)   Pulse 85   Temp 98.1 F (36.7 C) (Temporal)   Ht 5\' 3"  (1.6 m)   Wt 123 lb 6 oz (56 kg)   SpO2 98%   BMI 21.85 kg/m   GEN: No acute distress; alert,appropriate. PULM: Breathing comfortably in no respiratory distress PSYCH: Normally interactive.     There is a fairly large seborrheic keratosis on the left breast that is roughly 2 cm across is mildly irritated.  Laboratory and Imaging Data:  Assessment and Plan:     ICD-10-CM   1. Seborrheic keratosis  L82.1     2. Onychomadesis of toenail  L60.8 Ambulatory referral to Podiatry    3. Toenail fungus  B35.1  Ambulatory referral to Podiatry    4. Other specified hypothyroidism  E03.8 TSH    T4, free    T3, free    5. Protein-calorie malnutrition, mild (HCC)  E44.1 Hepatic function panel     The seborrheic keratosis on her left lateral breast is classic in appearance.  Is irritated.  She does have fairly advanced dementia, so I would like to leave this alone and not cause her any pain to try to remove it.  Extensive toenail fungus.  Podiatry is going to need to work on this and probably drill down her toes.  Last time we checked her thyroid  level, we do not think she had been taking all of her medication.  We will recheck this today.  Medication Management during today's office visit: No orders of the defined types were placed in this encounter.  There are no discontinued medications.  Orders placed today for conditions managed today: Orders Placed This Encounter  Procedures   TSH   T4, free   T3, free   Hepatic function panel   Ambulatory referral to Podiatry    Disposition: No follow-ups on file.  Dragon [employer] One speech-to-text software was used for transcription in this dictation.  Possible transcriptional errors can occur using Dragon  software.   Signed,  Kim Cole. Kim Cannady, MD   Outpatient Encounter Medications as of 07/23/2023  Medication Sig   carbidopa -levodopa  (SINEMET  IR) 25-100 MG tablet Take 1 tablet by mouth 3 (three) times daily.   donepezil  (ARICEPT ) 10 MG tablet Take 1 tablet (10 mg total) by mouth at bedtime.   levothyroxine  (SYNTHROID ) 100 MCG tablet Take 1 tablet (100 mcg total) by mouth daily. Take 1 tablet by mouth every morning on an empty stomach with water only.  No food or other medications for 30 minutes. No vitamins for 4 hours or later.   memantine  (NAMENDA ) 10 MG tablet Take 1 tablet (10 mg total) by mouth 2 (two) times daily.   simvastatin  (ZOCOR ) 20 MG tablet Take 1 tablet by mouth every evening   vitamin B-12 (CYANOCOBALAMIN ) 1000 MCG tablet  Take 1,000 mcg by mouth daily.   No facility-administered encounter medications on file as of 07/23/2023.

## 2023-07-23 ENCOUNTER — Encounter: Payer: Self-pay | Admitting: Family Medicine

## 2023-07-23 ENCOUNTER — Ambulatory Visit (INDEPENDENT_AMBULATORY_CARE_PROVIDER_SITE_OTHER): Admitting: Family Medicine

## 2023-07-23 VITALS — BP 130/70 | HR 85 | Temp 98.1°F | Ht 63.0 in | Wt 123.4 lb

## 2023-07-23 DIAGNOSIS — L821 Other seborrheic keratosis: Secondary | ICD-10-CM | POA: Diagnosis not present

## 2023-07-23 DIAGNOSIS — L608 Other nail disorders: Secondary | ICD-10-CM

## 2023-07-23 DIAGNOSIS — E038 Other specified hypothyroidism: Secondary | ICD-10-CM

## 2023-07-23 DIAGNOSIS — B351 Tinea unguium: Secondary | ICD-10-CM

## 2023-07-23 DIAGNOSIS — E441 Mild protein-calorie malnutrition: Secondary | ICD-10-CM

## 2023-07-23 DIAGNOSIS — N649 Disorder of breast, unspecified: Secondary | ICD-10-CM | POA: Diagnosis not present

## 2023-07-24 LAB — HEPATIC FUNCTION PANEL
ALT: 3 U/L (ref 0–35)
AST: 11 U/L (ref 0–37)
Albumin: 4.1 g/dL (ref 3.5–5.2)
Alkaline Phosphatase: 83 U/L (ref 39–117)
Bilirubin, Direct: 0.2 mg/dL (ref 0.0–0.3)
Total Bilirubin: 0.9 mg/dL (ref 0.2–1.2)
Total Protein: 6.4 g/dL (ref 6.0–8.3)

## 2023-07-24 LAB — TSH: TSH: 0.17 u[IU]/mL — ABNORMAL LOW (ref 0.35–5.50)

## 2023-07-24 LAB — T4, FREE: Free T4: 1.38 ng/dL (ref 0.60–1.60)

## 2023-07-24 LAB — T3, FREE: T3, Free: 3.2 pg/mL (ref 2.3–4.2)

## 2023-07-27 ENCOUNTER — Encounter: Payer: Self-pay | Admitting: Family Medicine

## 2023-07-27 MED ORDER — LEVOTHYROXINE SODIUM 88 MCG PO TABS: 88.0000 ug | ORAL_TABLET | Freq: Every day | ORAL | 3 refills | Status: DC

## 2023-07-27 NOTE — Addendum Note (Signed)
 Addended by: Scherrie Curt on: 07/27/2023 04:32 PM   Modules accepted: Orders

## 2023-08-14 ENCOUNTER — Ambulatory Visit: Admitting: Podiatry

## 2023-08-14 ENCOUNTER — Encounter: Payer: Self-pay | Admitting: Podiatry

## 2023-08-14 VITALS — Ht 63.0 in | Wt 123.4 lb

## 2023-08-14 DIAGNOSIS — M79675 Pain in left toe(s): Secondary | ICD-10-CM

## 2023-08-14 DIAGNOSIS — M79674 Pain in right toe(s): Secondary | ICD-10-CM

## 2023-08-14 DIAGNOSIS — B351 Tinea unguium: Secondary | ICD-10-CM | POA: Diagnosis not present

## 2023-08-14 NOTE — Progress Notes (Signed)
   Chief Complaint  Patient presents with   Nail Problem    Pt is here for [facility].    SUBJECTIVE Patient presents to office today complaining of elongated, thickened nails that cause pain while ambulating in shoes.  Patient is unable to trim their own nails. Patient is here for further evaluation and treatment.  Past Medical History:  Diagnosis Date   Allergic rhinitis due to pollen    Alzheimer's disease (HCC) 01/28/2020   Diabetes mellitus type 2 in obese 11/17/2013   Hyperlipidemia    Hypertension    Hypothyroid    Migraine    NONE RECENT    Not on File   OBJECTIVE General Patient is awake, alert, and oriented x 3 and in no acute distress. Derm Skin is dry and supple bilateral. Negative open lesions or macerations. Remaining integument unremarkable. Nails are tender, long, thickened and dystrophic with subungual debris, consistent with onychomycosis, 1-5 bilateral. No signs of infection noted. Vasc  DP and PT pedal pulses palpable bilaterally. Temperature gradient within normal limits.  Neuro Epicritic and protective threshold sensation grossly intact bilaterally.  Musculoskeletal Exam No symptomatic pedal deformities noted bilateral. Muscular strength within normal limits.  ASSESSMENT 1.  Pain due to onychomycosis of toenails both  PLAN OF CARE 1. Patient evaluated today.  2. Instructed to maintain good pedal hygiene and foot care.  3. Mechanical debridement of nails 1-5 bilaterally performed using a nail nipper. Filed with dremel without incident.  4. Return to clinic in 3 mos.    Dot Gazella, DPM [facility]  Dr. Dot Gazella, DPM    [address] [place], [place] [address]                Office [phone]  Fax [phone]

## 2023-09-22 ENCOUNTER — Ambulatory Visit

## 2023-10-29 ENCOUNTER — Encounter: Payer: Self-pay | Admitting: Neurology

## 2023-10-29 DIAGNOSIS — E038 Other specified hypothyroidism: Secondary | ICD-10-CM

## 2023-10-30 MED ORDER — LEVOTHYROXINE SODIUM 88 MCG PO TABS: 88.0000 ug | ORAL_TABLET | Freq: Every day | ORAL | 3 refills | Status: AC

## 2023-11-10 ENCOUNTER — Ambulatory Visit: Admitting: Neurology

## 2023-11-12 ENCOUNTER — Ambulatory Visit: Admitting: Podiatry

## 2023-11-12 DIAGNOSIS — Z91199 Patient's noncompliance with other medical treatment and regimen due to unspecified reason: Secondary | ICD-10-CM

## 2023-11-12 NOTE — Progress Notes (Signed)
 1. No-show for appointment

## 2023-12-03 ENCOUNTER — Ambulatory Visit: Admitting: Neurology

## 2023-12-09 ENCOUNTER — Encounter: Payer: Self-pay | Admitting: Family Medicine

## 2023-12-11 ENCOUNTER — Ambulatory Visit (INDEPENDENT_AMBULATORY_CARE_PROVIDER_SITE_OTHER): Admitting: Family Medicine

## 2023-12-11 ENCOUNTER — Encounter: Payer: Self-pay | Admitting: Family Medicine

## 2023-12-11 VITALS — BP 144/66 | HR 66 | Temp 98.0°F | Ht 63.0 in | Wt 124.0 lb

## 2023-12-11 DIAGNOSIS — R8290 Unspecified abnormal findings in urine: Secondary | ICD-10-CM | POA: Diagnosis not present

## 2023-12-11 LAB — POC URINALSYSI DIPSTICK (AUTOMATED)
Bilirubin, UA: NEGATIVE
Blood, UA: NEGATIVE
Glucose, UA: NEGATIVE
Ketones, UA: NEGATIVE
Nitrite, UA: NEGATIVE
Protein, UA: POSITIVE — AB
Spec Grav, UA: 1.015 (ref 1.010–1.025)
Urobilinogen, UA: 0.2 U/dL
pH, UA: 6 (ref 5.0–8.0)

## 2023-12-11 MED ORDER — CEPHALEXIN 500 MG PO CAPS: 500.0000 mg | ORAL_CAPSULE | Freq: Three times a day (TID) | ORAL | 0 refills | Status: AC

## 2023-12-11 NOTE — Progress Notes (Signed)
 Patient ID: Kim Cole, female    DOB: [birth year], 78 y.o.   MRN: [id number]  This visit was conducted in person.  BP (!) 144/66   Pulse 66   Temp 98 F (36.7 C) (Temporal)   Ht 5' 3 (1.6 m)   Wt 124 lb (56.2 kg)   SpO2 98%   BMI 21.97 kg/m    CC:  Chief Complaint  Patient presents with   Abnormal Odor Urine    Subjective:   HPI: Kim Cole is a 78 y.o. female presenting on 12/11/2023 for Abnormal Odor Urine   Son is present for history. SHe has parkinson's and memery issues.  Urinary Frequency  This is a new problem. The current episode started in the past 7 days. The problem has been gradually worsening. The pain is at a severity of 0/10. The patient is experiencing no pain. There has been no fever. Associated symptoms include frequency. Pertinent negatives include no chills, flank pain, nausea, urgency or vomiting. Associated symptoms comments:  No mental status changes  Noting color change and odor of urine.. There is no history of catheterization, kidney stones, recurrent UTIs, a single kidney, urinary stasis or a urological procedure.         Relevant past medical, surgical, family and social history reviewed and updated as indicated. Interim medical history since our last visit reviewed. Allergies and medications reviewed and updated. Outpatient Medications Prior to Visit  Medication Sig Dispense Refill   carbidopa -levodopa  (SINEMET  IR) 25-100 MG tablet Take 1 tablet by mouth 3 (three) times daily. 90 tablet 6   donepezil  (ARICEPT ) 10 MG tablet Take 1 tablet (10 mg total) by mouth at bedtime. 90 tablet 3   levothyroxine  (SYNTHROID ) 88 MCG tablet Take 1 tablet (88 mcg total) by mouth daily. Take 1 tablet by mouth every morning on an empty stomach with water only.  No food or other medications for 30 minutes. No vitamins for 4 hours or later. 90 tablet 3   memantine  (NAMENDA ) 10 MG tablet Take 1 tablet (10 mg total) by mouth 2 (two) times daily. 180  tablet 3   simvastatin  (ZOCOR ) 20 MG tablet Take 1 tablet by mouth every evening 90 tablet 3   vitamin B-12 (CYANOCOBALAMIN ) 1000 MCG tablet Take 1,000 mcg by mouth daily.     No facility-administered medications prior to visit.     Per HPI unless specifically indicated in ROS section below Review of Systems  Constitutional:  Negative for chills, fatigue and fever.  HENT:  Negative for congestion.   Eyes:  Negative for pain.  Respiratory:  Negative for cough and shortness of breath.   Cardiovascular:  Negative for chest pain, palpitations and leg swelling.  Gastrointestinal:  Negative for abdominal pain, nausea and vomiting.  Genitourinary:  Positive for frequency. Negative for dysuria, flank pain, urgency and vaginal bleeding.  Musculoskeletal:  Negative for back pain.  Neurological:  Negative for syncope, light-headedness and headaches.  Psychiatric/Behavioral:  Negative for dysphoric mood.    Objective:  BP (!) 144/66   Pulse 66   Temp 98 F (36.7 C) (Temporal)   Ht 5' 3 (1.6 m)   Wt 124 lb (56.2 kg)   SpO2 98%   BMI 21.97 kg/m   Wt Readings from Last 3 Encounters:  12/11/23 124 lb (56.2 kg)  08/14/23 123 lb 6.1 oz (56 kg)  07/23/23 123 lb 6 oz (56 kg)      Physical Exam Constitutional:  General: She is not in acute distress.    Appearance: Normal appearance. She is well-developed. She is not ill-appearing or toxic-appearing.  HENT:     Head: Normocephalic.     Right Ear: Hearing, tympanic membrane, ear canal and external ear normal. Tympanic membrane is not erythematous, retracted or bulging.     Left Ear: Hearing, tympanic membrane, ear canal and external ear normal. Tympanic membrane is not erythematous, retracted or bulging.     Nose: No mucosal edema or rhinorrhea.     Right Sinus: No maxillary sinus tenderness or frontal sinus tenderness.     Left Sinus: No maxillary sinus tenderness or frontal sinus tenderness.     Mouth/Throat:     Pharynx: Uvula  midline.  Eyes:     General: Lids are normal. Lids are everted, no foreign bodies appreciated.     Conjunctiva/sclera: Conjunctivae normal.     Pupils: Pupils are equal, round, and reactive to light.  Neck:     Thyroid : No thyroid  mass or thyromegaly.     Vascular: No carotid bruit.     Trachea: Trachea normal.  Cardiovascular:     Rate and Rhythm: Normal rate and regular rhythm.     Pulses: Normal pulses.     Heart sounds: Normal heart sounds, S1 normal and S2 normal. No murmur heard.    No friction rub. No gallop.  Pulmonary:     Effort: Pulmonary effort is normal. No tachypnea or respiratory distress.     Breath sounds: Normal breath sounds. No decreased breath sounds, wheezing, rhonchi or rales.  Abdominal:     General: Bowel sounds are normal.     Palpations: Abdomen is soft.     Tenderness: There is no abdominal tenderness.  Musculoskeletal:     Cervical back: Normal range of motion and neck supple.  Skin:    General: Skin is warm and dry.     Findings: No rash.  Neurological:     Mental Status: She is alert.  Psychiatric:        Mood and Affect: Mood is not anxious or depressed.        Speech: Speech normal.        Behavior: Behavior normal. Behavior is cooperative.        Thought Content: Thought content normal.        Judgment: Judgment normal.       Results for orders placed or performed in visit on 12/11/23  POCT Urinalysis Dipstick (Automated)   Collection Time: 12/11/23  3:12 PM  Result Value Ref Range   Color, UA Yellow    Clarity, UA Clear    Glucose, UA Negative Negative   Bilirubin, UA Negative    Ketones, UA Negative    Spec Grav, UA 1.015 1.010 - 1.025   Blood, UA Negative    pH, UA 6.0 5.0 - 8.0   Protein, UA Positive (A) Negative   Urobilinogen, UA 0.2 0.2 or 1.0 E.U./dL   Nitrite, UA Negative    Leukocytes, UA Small (1+) (A) Negative  Urine Culture   Collection Time: 12/11/23  3:25 PM   Specimen: Urine  Result Value Ref Range   MICRO  NUMBER: [id number]    SPECIMEN QUALITY: Adequate    Sample Source NOT GIVEN    STATUS: FINAL    Result: No Growth     Assessment and Plan  Abnormal urine odor Assessment & Plan: Acute, change in urine odor and concern for possible infection on urinalysis.  Will treat empirically with Keflex  500 mg p.o. 3 times daily x 7 days. Will send urine for culture. Recommend increasing water.  Return and ER precautions provided.  Orders: -     POCT Urinalysis Dipstick (Automated) -     Urine Culture  Other orders -     Cephalexin ; Take 1 capsule (500 mg total) by mouth 3 (three) times daily for 7 days.  Dispense: 21 capsule; Refill: 0    No follow-ups on file.   Greig Ring, MD

## 2023-12-12 LAB — URINE CULTURE
MICRO NUMBER:: 16992102
Result:: NO GROWTH
SPECIMEN QUALITY:: ADEQUATE

## 2023-12-14 ENCOUNTER — Ambulatory Visit: Payer: Self-pay | Admitting: Family Medicine

## 2023-12-15 ENCOUNTER — Encounter: Payer: Self-pay | Admitting: Pharmacist

## 2023-12-15 ENCOUNTER — Telehealth: Payer: Self-pay | Admitting: Family Medicine

## 2023-12-15 DIAGNOSIS — R8290 Unspecified abnormal findings in urine: Secondary | ICD-10-CM | POA: Insufficient documentation

## 2023-12-15 NOTE — Telephone Encounter (Signed)
 Please call son [name] with results of urine culture (no bacteria)... Send a message via MyChart but he also requested a phone call.    Son [name]: [phone]

## 2023-12-15 NOTE — Progress Notes (Signed)
 Pharmacy Quality Measure Review  This patient is appearing on a report for being at risk of failing the adherence measure for cholesterol (statin) medications this calendar year.   Medication: simvastatin  20 mg Last fill date: 05/26/23 for 90 day supply  Insurance report was not up to date. No action needed at this time.  Medication significantly overdue, though refilled on 10/31/23 x90ds.  Current prescription is now expired. Will need new Rx on next refill.  Refill due 01/29/24. Reminder set.

## 2023-12-15 NOTE — Assessment & Plan Note (Signed)
 Acute, change in urine odor and concern for possible infection on urinalysis.  Will treat empirically with Keflex  500 mg p.o. 3 times daily x 7 days. Will send urine for culture. Recommend increasing water.  Return and ER precautions provided.

## 2023-12-16 NOTE — Telephone Encounter (Signed)
 Michael notified by telpehone that his mom's urine culture showed no bacteria She can stopped the antibiotics.  Please follow up with Dr. Watt if her symptoms don't improve as expected.

## 2024-01-13 ENCOUNTER — Ambulatory Visit

## 2024-01-13 VITALS — Ht 63.0 in | Wt 124.0 lb

## 2024-01-13 DIAGNOSIS — Z0000 Encounter for general adult medical examination without abnormal findings: Secondary | ICD-10-CM

## 2024-01-13 NOTE — Progress Notes (Signed)
 Subjective:   Kim Cole is a 78 y.o. who presents for a Medicare Wellness preventive visit.  As a reminder, Annual Wellness Visits don't include a physical exam, and some assessments may be limited, especially if this visit is performed virtually. We may recommend an in-person follow-up visit with your provider if needed.  Visit Complete: Virtual I connected with  Kim Cole on 01/13/24 by a audio enabled telemedicine application and verified that I am speaking with the correct person using two identifiers.  Patient Location: Home  Provider Location: Office/Clinic  I discussed the limitations of evaluation and management by telemedicine. The patient expressed understanding and agreed to proceed.  Vital Signs: Because this visit was a virtual/telehealth visit, some criteria may be missing or patient reported. Any vitals not documented were not able to be obtained and vitals that have been documented are patient reported.  VideoDeclined- This patient declined Librarian, academic. Therefore the visit was completed with audio only.  Persons Participating in Visit: Son Garnette and patient was present during visit.  AWV Questionnaire: No: Patient Medicare AWV questionnaire was not completed prior to this visit.  Cardiac Risk Factors include: advanced age (>107men, >37 women);diabetes mellitus;dyslipidemia;hypertension;sedentary lifestyle     Objective:    Today's Vitals   01/13/24 1540  Weight: 124 lb (56.2 kg)  Height: 5' 3 (1.6 m)   Body mass index is 21.97 kg/m.     11/18/2019   11:57 AM 11/05/2018   12:31 PM 07/22/2017    9:26 AM 09/10/2015   11:10 AM 09/07/2015   11:44 AM  Advanced Directives  Does Patient Have a Medical Advance Directive? Yes Yes Yes  Yes  Yes   Type of Estate agent of [place];Living will Healthcare Power of [place];Living will Healthcare Power of [place];Living will Healthcare Power of  [place];Living will  Living will;Healthcare Power of Attorney   Does patient want to make changes to medical advance directive?     No - Patient declined   Copy of Healthcare Power of Attorney in Chart?  No - copy requested  No - copy requested  No - copy requested  No - copy requested      Data saved with a previous flowsheet row definition    Current Medications (verified) Outpatient Encounter Medications as of 01/13/2024  Medication Sig   carbidopa -levodopa  (SINEMET  IR) 25-100 MG tablet Take 1 tablet by mouth 3 (three) times daily.   donepezil  (ARICEPT ) 10 MG tablet Take 1 tablet (10 mg total) by mouth at bedtime.   levothyroxine  (SYNTHROID ) 88 MCG tablet Take 1 tablet (88 mcg total) by mouth daily. Take 1 tablet by mouth every morning on an empty stomach with water only.  No food or other medications for 30 minutes. No vitamins for 4 hours or later.   memantine  (NAMENDA ) 10 MG tablet Take 1 tablet (10 mg total) by mouth 2 (two) times daily.   simvastatin  (ZOCOR ) 20 MG tablet Take 1 tablet by mouth every evening   vitamin B-12 (CYANOCOBALAMIN ) 1000 MCG tablet Take 1,000 mcg by mouth daily.   No facility-administered encounter medications on file as of 01/13/2024.    Allergies (verified) Patient has no allergy information on record.   History: Past Medical History:  Diagnosis Date   Allergic rhinitis due to pollen    Alzheimer's disease (HCC) 01/28/2020   Diabetes mellitus type 2 in obese 11/17/2013   Hyperlipidemia    Hypertension    Hypothyroid    Migraine  NONE RECENT   Past Surgical History:  Procedure Laterality Date   COLONOSCOPY WITH PROPOFOL  N/A 09/10/2015   Procedure: COLONOSCOPY WITH PROPOFOL ;  Surgeon: Gladis MARLA Louder, MD;  Location: [facility];  Service: Endoscopy;  Laterality: N/A;   COLONSCOPY  FEW YRS AGO   2 SMALL POLYPS   TOTAL HIP ARTHROPLASTY Right 2011   Dr. Melodi   TUBAL LIGATION     Family History  Problem Relation Age of Onset   Diabetes  Mother    Diabetes Father    Social History   Socioeconomic History   Marital status: Widowed    Spouse name: Not on file   Number of children: Not on file   Years of education: Not on file   Highest education level: Not on file  Occupational History   Occupation: retired    Associate Professor: [employer]  Tobacco Use   Smoking status: Never   Smokeless tobacco: Never  Vaping Use   Vaping status: Never Used  Substance and Sexual Activity   Alcohol use: Not Currently    Comment: SELDOM   Drug use: No   Sexual activity: Not Currently  Other Topics Concern   Not on file  Social History Narrative   Worked 40 years for [employer] / [employer]   Married   Children and Grandchildren   [name] Hollenbaugh's mother   Right Handed   Drinks a soda seldomly   Social Drivers of Corporate investment banker Strain: Low Risk  (11/05/2018)   Overall Financial Resource Strain (CARDIA)    Difficulty of Paying Living Expenses: Not hard at all  Food Insecurity: No Food Insecurity (11/20/2019)   [facility]   Hunger Vital Sign    Within the past 12 months, you worried that your food would run out before you got the money to buy more.: Never true    Within the past 12 months, the food you bought just didn't last and you didn't have money to get more.: Never true  Transportation Needs: No Transportation Needs (01/13/2024)   PRAPARE - Administrator, Civil Service (Medical): No    Lack of Transportation (Non-Medical): No  Physical Activity: Inactive (01/13/2024)   Exercise Vital Sign    Days of Exercise per Week: 0 days    Minutes of Exercise per Session: 0 min  Stress: No Stress Concern Present (01/13/2024)   [name] of Occupational Health - Occupational Stress Questionnaire    Feeling of Stress: Not at all  Social Connections: Socially Isolated (01/13/2024)   Social Connection and Isolation Panel    Frequency of  Communication with Friends and Family: Three times a week    Frequency of Social Gatherings with Friends and Family: Never    Attends Religious Services: Never    Database administrator or Organizations: No    Attends Banker Meetings: Never    Marital Status: Widowed    Tobacco Counseling Counseling given: Not Answered    Clinical Intake:  Pre-visit preparation completed: Yes  Pain : No/denies pain     BMI - recorded: 21.97 Nutritional Status: BMI of 19-24  Normal Nutritional Risks: None Diabetes: Yes CBG done?: No Did pt. bring in CBG monitor from home?: No  Lab Results  Component Value Date   HGBA1C 5.2 10/06/2022   HGBA1C 5.2 10/02/2021   HGBA1C 4.7 10/03/2019     How often do you need to have someone help you when you  read instructions, pamphlets, or other written materials from your doctor or pharmacy?: 1 - Never  Interpreter Needed?: No  Comments: lives alone:has HHAs Information entered by :: B.Cotey Rakes,LPN   Activities of Daily Living     01/13/2024    3:47 PM  In your present state of health, do you have any difficulty performing the following activities:  Hearing? 0  Vision? 0  Difficulty concentrating or making decisions? 1  Walking or climbing stairs? 1  Dressing or bathing? 1  Doing errands, shopping? 1  Preparing Food and eating ? Y  Using the Toilet? Y  In the past six months, have you accidently leaked urine? N  Do you have problems with loss of bowel control? N  Managing your Medications? Y  Managing your Finances? Y  Housekeeping or managing your Housekeeping? Y    Patient Care Team: Watt Mirza, MD as PCP - General (Family Medicine) Laurice Francis NOVAK, OD (Optometry)  I have updated your Care Teams any recent Medical Services you may have received from other providers in the past year.     Assessment:   This is a routine wellness examination for Shenise.  Hearing/Vision screen Hearing Screening - Comments::  Patient denies any hearing difficulties.   Vision Screening - Comments:: Pt says their vision is good without glasses Dr  MARLA Laurice   Goals Addressed   None    Depression Screen     01/13/2024    3:46 PM 05/14/2023    2:49 PM 10/13/2022   10:51 AM 10/07/2021   10:35 AM 11/05/2018   12:32 PM 07/22/2017    9:22 AM 07/17/2016    4:23 PM  PHQ 2/9 Scores  PHQ - 2 Score 0 3 2 0 0 0 0  PHQ- 9 Score  11 5  0 0     Fall Risk     01/13/2024    3:44 PM 05/14/2023    2:49 PM 10/13/2022   10:51 AM 10/07/2021   10:35 AM 10/03/2019    2:30 PM  Fall Risk   Falls in the past year? 0 0 0 0 1  Number falls in past yr: 0 0 0  0  Injury with Fall? 0 0 0  1  Risk for fall due to : Impaired balance/gait No Fall Risks Impaired mobility    Follow up Falls prevention discussed;Education provided Falls evaluation completed Falls evaluation completed      MEDICARE RISK AT HOME:  Medicare Risk at Home Any stairs in or around the home?: No If so, are there any without handrails?: Yes Home free of loose throw rugs in walkways, pet beds, electrical cords, etc?: Yes Adequate lighting in your home to reduce risk of falls?: Yes Life alert?: No Use of a cane, walker or w/c?: Yes Grab bars in the bathroom?: Yes Shower chair or bench in shower?: Yes Elevated toilet seat or a handicapped toilet?: No  TIMED UP AND GO:  Was the test performed?  No  Cognitive Function: Declined/Normal: No cognitive concerns noted by patient or family. Patient alert, oriented, able to answer questions appropriately and recall recent events. No signs of memory loss or confusion.    01/13/2024    4:04 PM 08/06/2020    1:00 PM 02/07/2020    8:30 AM 11/05/2018   12:35 PM 07/22/2017    9:24 AM  MMSE - Mini Mental State Exam  Not completed: Unable to complete      Orientation to time  1 1 5  5  Orientation to Place  5 3 5 5   Registration  3 3 3 3   Attention/ Calculation  0 0 5 0  Recall  1 1 1 1   Recall-comments    forgot 2 unable  to recall 2 of 3 words  Language- name 2 objects  1 2 0 0  Language- repeat  1 1 1 1   Language- follow 3 step command  3 3 0 3  Language- read & follow direction  1 1 0 0  Write a sentence  1 1 0 0  Copy design  1 1 0 0  Total score  18 17 20 18       06/10/2021    9:15 AM  Montreal Cognitive Assessment   Visuospatial/ Executive (0/5) 0  Naming (0/3) 2  Attention: Read list of digits (0/2) 1  Attention: Read list of letters (0/1) 1  Attention: Serial 7 subtraction starting at 100 (0/3) 0  Language: Repeat phrase (0/2) 2  Language : Fluency (0/1) 0  Abstraction (0/2) 1  Delayed Recall (0/5) 0  Orientation (0/6) 1  Total 8  Adjusted Score (based on education) 9      Immunizations Immunization History  Administered Date(s) Administered   Fluad Quad(high Dose 65+) 01/26/2020   INFLUENZA, HIGH DOSE SEASONAL PF 04/26/2015, 01/12/2016, 02/07/2017, 01/18/2018, 01/10/2019   PFIZER(Purple Top)SARS-COV-2 Vaccination 06/22/2019, 07/13/2019   Pneumococcal Conjugate-13 11/16/2013   Pneumococcal Polysaccharide-23 10/11/2012    Screening Tests Health Maintenance  Topic Date Due   Diabetic kidney evaluation - Urine ACR  Never done   DTaP/Tdap/Td (1 - Tdap) Never done   Zoster Vaccines- Shingrix (1 of 2) Never done   OPHTHALMOLOGY EXAM  03/31/2016   DEXA SCAN  09/23/2019   Colonoscopy  09/09/2020   HEMOGLOBIN A1C  04/08/2023   FOOT EXAM  10/13/2023   COVID-19 Vaccine (3 - Pfizer risk series) 05/29/2024 (Originally 08/10/2019)   Influenza Vaccine  06/21/2024 (Originally 10/23/2023)   Diabetic kidney evaluation - eGFR measurement  04/26/2024   Medicare Annual Wellness (AWV)  01/12/2025   Pneumococcal Vaccine: 50+ Years  Completed   Hepatitis C Screening  Completed   Meningococcal B Vaccine  Aged Out   Mammogram  Discontinued    Health Maintenance Items Addressed: Pt declines all maintanence/preventattive care per son  Additional Screening:  Vision Screening: Recommended annual  ophthalmology exams for early detection of glaucoma and other disorders of the eye. Is the patient up to date with their annual eye exam?  No  Who is the provider or what is the name of the office in which the patient attends annual eye exams? Dr Winford says has not been in years-will not go  Dental Screening: Recommended annual dental exams for proper oral hygiene  Community Resource Referral / Chronic Care Management: CRR required this visit?  No   CCM required this visit?  Appt scheduled with PCP   Plan:    I have personally reviewed and noted the following in the patient's chart:   Medical and social history Use of alcohol, tobacco or illicit drugs  Current medications and supplements including opioid prescriptions. Patient is not currently taking opioid prescriptions. Functional ability and status Nutritional status Physical activity Advanced directives List of other physicians Hospitalizations, surgeries, and ER visits in previous 12 months Vitals Screenings to include cognitive, depression, and falls Referrals and appointments  In addition, I have reviewed and discussed with patient certain preventive protocols, quality metrics, and best practice recommendations. A written personalized care  plan for preventive services as well as general preventive health recommendations were provided to patient.   Erminio LITTIE Saris, LPN   [id number]   After Visit Summary: (MyChart) Due to this being a telephonic visit, the after visit summary with patients personalized plan was offered to patient via MyChart   Notes: I spoke with pt son Garnette who states the pt will not answer the phone for visit. He answered questions regarding his mom. I scheduled a CPE w/ PCP. The pt son says he will do his best to get her here as pt no longer wants to go anywhere

## 2024-01-13 NOTE — Patient Instructions (Signed)
 Kim Cole,  Thank you for taking the time for your Medicare Wellness Visit. I appreciate your continued commitment to your health goals. Please review the care plan we discussed, and feel free to reach out if I can assist you further.  Medicare recommends these wellness visits once per year to help you and your care team stay ahead of potential health issues. These visits are designed to focus on prevention, allowing your provider to concentrate on managing your acute and chronic conditions during your regular appointments.  Please note that Annual Wellness Visits do not include a physical exam. Some assessments may be limited, especially if the visit was conducted virtually. If needed, we may recommend a separate in-person follow-up with your provider.  Ongoing Care Seeing your primary care provider every 3 to 6 months helps us  monitor your health and provide consistent, personalized care.   Referrals If a referral was made during today's visit and you haven't received any updates within two weeks, please contact the referred provider directly to check on the status.  Recommended Screenings:  Health Maintenance  Topic Date Due   Yearly kidney health urinalysis for diabetes  Never done   DTaP/Tdap/Td vaccine (1 - Tdap) Never done   Zoster (Shingles) Vaccine (1 of 2) Never done   Eye exam for diabetics  03/31/2016   DEXA scan (bone density measurement)  09/23/2019   Colon Cancer Screening  09/09/2020   Hemoglobin A1C  04/08/2023   Complete foot exam   10/13/2023   COVID-19 Vaccine (3 - Pfizer risk series) 05/29/2024*   Flu Shot  06/21/2024*   Yearly kidney function blood test for diabetes  04/26/2024   Medicare Annual Wellness Visit  01/12/2025   Pneumococcal Vaccine for age over 81  Completed   Hepatitis C Screening  Completed   Meningitis B Vaccine  Aged Out   Breast Cancer Screening  Discontinued  *Topic was postponed. The date shown is not the original due date.        11/18/2019   11:57 AM  Advanced Directives  Does Patient Have a Medical Advance Directive? Yes  Type of Estate agent of [place];Living will   Advance Care Planning is important because it: Ensures you receive medical care that aligns with your values, goals, and preferences. Provides guidance to your family and loved ones, reducing the emotional burden of decision-making during critical moments.  Vision: Annual vision screenings are recommended for early detection of glaucoma, cataracts, and diabetic retinopathy. These exams can also reveal signs of chronic conditions such as diabetes and high blood pressure.  Dental: Annual dental screenings help detect early signs of oral cancer, gum disease, and other conditions linked to overall health, including heart disease and diabetes.  Please see the attached documents for additional preventive care recommendations.

## 2024-01-21 ENCOUNTER — Other Ambulatory Visit: Payer: Self-pay | Admitting: *Deleted

## 2024-01-21 MED ORDER — DONEPEZIL HCL 10 MG PO TABS
10.0000 mg | ORAL_TABLET | Freq: Every day | ORAL | 1 refills | Status: AC
Start: 2024-01-21 — End: ?

## 2024-01-27 ENCOUNTER — Other Ambulatory Visit: Payer: Self-pay | Admitting: *Deleted

## 2024-01-27 MED ORDER — SIMVASTATIN 20 MG PO TABS: ORAL_TABLET | ORAL | 0 refills | Status: DC

## 2024-02-12 ENCOUNTER — Other Ambulatory Visit: Payer: Self-pay | Admitting: Family Medicine

## 2024-02-12 MED ORDER — MEMANTINE HCL 10 MG PO TABS: 10.0000 mg | ORAL_TABLET | Freq: Two times a day (BID) | ORAL | 3 refills | Status: AC

## 2024-02-21 ENCOUNTER — Encounter: Payer: Self-pay | Admitting: Family Medicine

## 2024-02-21 NOTE — Progress Notes (Unsigned)
 Kim Garciagarcia T. Ramsey Midgett, MD, CAQ Sports Medicine [facility] [address] [place], [address]  Phone: [phone]  FAX: [phone]  Kim Cole - 78 y.o. female  MRN [id number]  Date of Birth: 27-Sep-1945  Date: 02/22/2024  PCP: Watt Mirza, MD  Referral: Watt Mirza, MD  No chief complaint on file.  Patient Care Team: Watt Mirza, MD as PCP - General (Family Medicine) Laurice Francis NOVAK, OD (Optometry) Subjective:   Kim Cole is a 78 y.o. pleasant patient who presents with the following:  Discussed the use of AI scribe software for clinical note transcription with the patient, who gave verbal consent to proceed.  History of Present Illness     Health Maintenance Summary Reviewed and updated, unless pt declines services.  Tobacco History Reviewed. Non-smoker Alcohol: No concerns, no excessive use Exercise Habits: Some activity, rec at least 30 mins 5 times a week STD concerns: none Drug Use: None Lumps or breast concerns: no  Kim Cole is well-known for many years.  She has a history of Alzheimer's disease and parkinsonism.  I saw her son Kim Cole last week, and he did not have any kind of specific concerns about his mother.  She is still at home with a lot of assistance from caregivers and family.  She continues to see neurology.  Diabetes has been stable off of medication for years.  Hypothyroidism, her son tells me that this has been variable, she has missed doses and taking it different times.  Hypertension, stable.  Health Maintenance  Topic Date Due   Diabetic kidney evaluation - Urine ACR  Never done   DTaP/Tdap/Td (1 - Tdap) Never done   Zoster Vaccines- Shingrix (1 of 2) Never done   OPHTHALMOLOGY EXAM  03/31/2016   Bone Density Scan  09/23/2019   Colonoscopy  09/09/2020   HEMOGLOBIN A1C  04/08/2023   FOOT EXAM  10/13/2023   COVID-19 Vaccine (3 - Pfizer risk series) 05/29/2024  (Originally 08/10/2019)   Influenza Vaccine  06/21/2024 (Originally 10/23/2023)   Diabetic kidney evaluation - eGFR measurement  04/26/2024   Medicare Annual Wellness (AWV)  01/12/2025   Pneumococcal Vaccine: 50+ Years  Completed   Hepatitis C Screening  Completed   Meningococcal B Vaccine  Aged Out   Mammogram  Discontinued    Immunization History  Administered Date(s) Administered   Fluad Quad(high Dose 65+) 01/26/2020   INFLUENZA, HIGH DOSE SEASONAL PF 04/26/2015, 01/12/2016, 02/07/2017, 01/18/2018, 01/10/2019   PFIZER(Purple Top)SARS-COV-2 Vaccination 06/22/2019, 07/13/2019   Pneumococcal Conjugate-13 11/16/2013   Pneumococcal Polysaccharide-23 10/11/2012   Patient Active Problem List   Diagnosis Date Noted   Parkinsonism (HCC) 01/20/2023    Priority: High   Alzheimer's disease (HCC) 01/28/2020    Priority: High   Diabetes mellitus type 2, diet-controlled (HCC) 11/17/2013    Priority: High   Mixed hyperlipidemia     Priority: Medium    Essential hypertension     Priority: Medium    Hypothyroid     Priority: Medium    Vitamin B 12 deficiency 02/09/2020   Allergic rhinitis due to pollen    Migraine     Past Medical History:  Diagnosis Date   Allergic rhinitis due to pollen    Alzheimer's disease (HCC) 01/28/2020   Diabetes mellitus type 2 in obese 11/17/2013   Hyperlipidemia    Hypertension    Hypothyroid    Migraine    Parkinsonism (HCC)     Past Surgical  History:  Procedure Laterality Date   COLONOSCOPY WITH PROPOFOL  N/A 09/10/2015   Procedure: COLONOSCOPY WITH PROPOFOL ;  Surgeon: Gladis MARLA Louder, MD;  Location: [facility];  Service: Endoscopy;  Laterality: N/A;   TOTAL HIP ARTHROPLASTY Right 03/24/2009   Dr. Melodi   TUBAL LIGATION      Family History  Problem Relation Age of Onset   Diabetes Mother    Diabetes Father     Social History   Social History Narrative   Worked 40 years for [employer]    Married   Children and Grandchildren   Kim Cole Devito's mother   Right Handed   Drinks a soda seldomly    Past Medical History, Surgical History, Social History, Family History, Problem List, Medications, and Allergies have been reviewed and updated if relevant.  Review of Systems: Pertinent positives are listed above.  Otherwise, a full 14 point review of systems has been done in full and it is negative except where it is noted positive.  Objective:   There were no vitals taken for this visit. Ideal Body Weight:   No results found.    01/13/2024    3:46 PM 05/14/2023    2:49 PM 10/13/2022   10:51 AM 10/07/2021   10:35 AM 11/05/2018   12:32 PM  Depression screen PHQ 2/9  Decreased Interest 0 3 2 0 0  Down, Depressed, Hopeless 0 0 0 0 0  PHQ - 2 Score 0 3 2 0 0  Altered sleeping  0 0  0  Tired, decreased energy  3 2  0  Change in appetite  3 0  0  Feeling bad or failure about yourself   0 0  0  Trouble concentrating  1 0  0  Moving slowly or fidgety/restless  1 1  0  Suicidal thoughts  0 0  0  PHQ-9 Score  11  5   0   Difficult doing work/chores  Very difficult Somewhat difficult       Data saved with a previous flowsheet row definition     GEN: well developed, well nourished, no acute distress Eyes: conjunctiva and lids normal, PERRLA, EOMI ENT: TM clear, nares clear, oral exam WNL Neck: supple, no lymphadenopathy, no thyromegaly, no JVD Pulm: clear to auscultation and percussion, respiratory effort normal CV: regular rate and rhythm, S1-S2, no murmur, rub or gallop, no bruits Chest: no scars, masses, no lumps BREAST: breast exam declined GI: soft, non-tender; no hepatosplenomegaly, masses; active bowel sounds all quadrants GU: GU exam declined Lymph: no cervical, axillary or inguinal adenopathy MSK: gait normal, muscle tone and strength WNL, no joint swelling, effusions, discoloration, crepitus  SKIN: clear, good turgor, color WNL, no rashes, lesions, or  ulcerations Neuro: normal mental status, normal strength, sensation, and motion Psych: alert; oriented to person, place and time, normally interactive and not anxious or depressed in appearance.   All labs reviewed with patient. Results for orders placed or performed in visit on 12/11/23  POCT Urinalysis Dipstick (Automated)   Collection Time: 12/11/23  3:12 PM  Result Value Ref Range   Color, UA Yellow    Clarity, UA Clear    Glucose, UA Negative Negative   Bilirubin, UA Negative    Ketones, UA Negative    Spec Grav, UA 1.015 1.010 - 1.025   Blood, UA Negative    pH, UA 6.0 5.0 - 8.0   Protein, UA Positive (A) Negative   Urobilinogen, UA 0.2 0.2 or  1.0 E.U./dL   Nitrite, UA Negative    Leukocytes, UA Small (1+) (A) Negative  Urine Culture   Collection Time: 12/11/23  3:25 PM   Specimen: Urine  Result Value Ref Range   MICRO NUMBER: [id number]    SPECIMEN QUALITY: Adequate    Sample Source NOT GIVEN    STATUS: FINAL    Result: No Growth    No results found.  Assessment and Plan:     ICD-10-CM   1. Healthcare maintenance  Z00.00     2. Alzheimer's disease (HCC)  G30.9    F02.80     3. 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 Diabetes mellitus type 2, diet-controlled (HCC)  E11.9     5. Essential hypertension  I10      Assessment & Plan   Health Maintenance Exam: The patient's preventative maintenance and recommended screening tests for an annual wellness exam were reviewed in full today. Brought up to date unless services declined.  Counselled on the importance of diet, exercise, and its role in overall health and mortality. The patient's FH and SH was reviewed, including their home life, tobacco status, and drug and alcohol status.  Follow-up in 1 year for physical exam or additional follow-up below.  Disposition: No follow-ups on file.  Future Appointments  Date Time Provider Department Center  02/22/2024   4:00 PM Watt Mirza, MD [facility] [address]  05/16/2024 10:30 AM Onita Duos, MD [facility] None  01/16/2025  3:40 PM [facility] ANNUAL WELLNESS VISIT 1 [facility] [address]    No orders of the defined types were placed in this encounter.  There are no discontinued medications. No orders of the defined types were placed in this encounter.   Signed,  Mirza DASEN. Cesia Orf, MD   Allergies as of 02/22/2024   Not on File      Medication List        Accurate as of February 21, 2024 12:14 PM. If you have any questions, ask your nurse or doctor.          carbidopa -levodopa  25-100 MG tablet Commonly known as: SINEMET  IR Take 1 tablet by mouth 3 (three) times daily.   cyanocobalamin  1000 MCG tablet Commonly known as: VITAMIN B12 Take 1,000 mcg by mouth daily.   donepezil  10 MG tablet Commonly known as: ARICEPT  Take 1 tablet (10 mg total) by mouth at bedtime.   levothyroxine  88 MCG tablet Commonly known as: SYNTHROID  Take 1 tablet (88 mcg total) by mouth daily. Take 1 tablet by mouth every morning on an empty stomach with water only.  No food or other medications for 30 minutes. No vitamins for 4 hours or later.   memantine  10 MG tablet Commonly known as: NAMENDA  Take 1 tablet (10 mg total) by mouth 2 (two) times daily.   simvastatin  20 MG tablet Commonly known as: ZOCOR  Take 1 tablet by mouth every evening

## 2024-02-22 ENCOUNTER — Ambulatory Visit: Admitting: Family Medicine

## 2024-02-22 ENCOUNTER — Ambulatory Visit
Admission: RE | Admit: 2024-02-22 | Discharge: 2024-02-22 | Disposition: A | Source: Ambulatory Visit | Attending: Family Medicine | Admitting: Family Medicine

## 2024-02-22 VITALS — BP 162/78 | HR 80 | Temp 98.1°F | Ht 63.0 in | Wt 145.2 lb

## 2024-02-22 DIAGNOSIS — E538 Deficiency of other specified B group vitamins: Secondary | ICD-10-CM

## 2024-02-22 DIAGNOSIS — G8929 Other chronic pain: Secondary | ICD-10-CM | POA: Diagnosis not present

## 2024-02-22 DIAGNOSIS — F0280 Dementia in other diseases classified elsewhere without behavioral disturbance: Secondary | ICD-10-CM

## 2024-02-22 DIAGNOSIS — Z79899 Other long term (current) drug therapy: Secondary | ICD-10-CM | POA: Diagnosis not present

## 2024-02-22 DIAGNOSIS — M1612 Unilateral primary osteoarthritis, left hip: Secondary | ICD-10-CM | POA: Diagnosis not present

## 2024-02-22 DIAGNOSIS — Z23 Encounter for immunization: Secondary | ICD-10-CM | POA: Diagnosis not present

## 2024-02-22 DIAGNOSIS — E119 Type 2 diabetes mellitus without complications: Secondary | ICD-10-CM

## 2024-02-22 DIAGNOSIS — M25552 Pain in left hip: Secondary | ICD-10-CM | POA: Diagnosis not present

## 2024-02-22 DIAGNOSIS — I10 Essential (primary) hypertension: Secondary | ICD-10-CM | POA: Diagnosis not present

## 2024-02-22 DIAGNOSIS — Z0000 Encounter for general adult medical examination without abnormal findings: Secondary | ICD-10-CM | POA: Diagnosis not present

## 2024-02-22 DIAGNOSIS — G20B1 Parkinson's disease with dyskinesia, without mention of fluctuations: Secondary | ICD-10-CM

## 2024-02-22 DIAGNOSIS — E038 Other specified hypothyroidism: Secondary | ICD-10-CM

## 2024-02-22 DIAGNOSIS — E782 Mixed hyperlipidemia: Secondary | ICD-10-CM | POA: Diagnosis not present

## 2024-02-22 DIAGNOSIS — Z96641 Presence of right artificial hip joint: Secondary | ICD-10-CM | POA: Diagnosis not present

## 2024-02-22 MED ORDER — CELECOXIB 200 MG PO CAPS: 200.0000 mg | ORAL_CAPSULE | Freq: Every day | ORAL | 1 refills | Status: AC

## 2024-02-23 ENCOUNTER — Encounter: Payer: Self-pay | Admitting: Family Medicine

## 2024-02-23 LAB — CBC WITH DIFFERENTIAL/PLATELET
Basophils Absolute: 0 K/uL (ref 0.0–0.1)
Basophils Relative: 0.9 % (ref 0.0–3.0)
Eosinophils Absolute: 0 K/uL (ref 0.0–0.7)
Eosinophils Relative: 0.5 % (ref 0.0–5.0)
HCT: 38 % (ref 36.0–46.0)
Hemoglobin: 12.7 g/dL (ref 12.0–15.0)
Lymphocytes Relative: 22.2 % (ref 12.0–46.0)
Lymphs Abs: 1.2 K/uL (ref 0.7–4.0)
MCHC: 33.5 g/dL (ref 30.0–36.0)
MCV: 89.6 fl (ref 78.0–100.0)
Monocytes Absolute: 0.3 K/uL (ref 0.1–1.0)
Monocytes Relative: 6.2 % (ref 3.0–12.0)
Neutro Abs: 3.7 K/uL (ref 1.4–7.7)
Neutrophils Relative %: 70.2 % (ref 43.0–77.0)
Platelets: 189 K/uL (ref 150.0–400.0)
RBC: 4.24 Mil/uL (ref 3.87–5.11)
RDW: 15.3 % (ref 11.5–15.5)
WBC: 5.3 K/uL (ref 4.0–10.5)

## 2024-02-23 LAB — HEPATIC FUNCTION PANEL
ALT: 9 U/L (ref 0–35)
AST: 15 U/L (ref 0–37)
Albumin: 4.4 g/dL (ref 3.5–5.2)
Alkaline Phosphatase: 89 U/L (ref 39–117)
Bilirubin, Direct: 0.1 mg/dL (ref 0.0–0.3)
Total Bilirubin: 0.5 mg/dL (ref 0.2–1.2)
Total Protein: 6.8 g/dL (ref 6.0–8.3)

## 2024-02-23 LAB — BASIC METABOLIC PANEL WITH GFR
BUN: 26 mg/dL — ABNORMAL HIGH (ref 6–23)
CO2: 31 meq/L (ref 19–32)
Calcium: 10.1 mg/dL (ref 8.4–10.5)
Chloride: 103 meq/L (ref 96–112)
Creatinine, Ser: 1.15 mg/dL (ref 0.40–1.20)
GFR: 46.08 mL/min — ABNORMAL LOW (ref >60.00)
Glucose, Bld: 109 mg/dL — ABNORMAL HIGH (ref 70–99)
Potassium: 4.2 meq/L (ref 3.5–5.1)
Sodium: 141 meq/L (ref 135–145)

## 2024-02-23 LAB — HEMOGLOBIN A1C: Hgb A1c MFr Bld: 5.1 % (ref 4.6–6.5)

## 2024-02-23 LAB — LIPID PANEL
Cholesterol: 174 mg/dL (ref 0–200)
HDL: 76.9 mg/dL (ref >39.00)
LDL Cholesterol: 74 mg/dL (ref 0–99)
NonHDL: 96.85
Total CHOL/HDL Ratio: 2
Triglycerides: 115 mg/dL (ref 0.0–149.0)
VLDL: 23 mg/dL (ref 0.0–40.0)

## 2024-02-23 LAB — TSH: TSH: 9.51 u[IU]/mL — ABNORMAL HIGH (ref 0.35–5.50)

## 2024-02-23 LAB — T4, FREE: Free T4: 0.53 ng/dL — ABNORMAL LOW (ref 0.60–1.60)

## 2024-02-23 LAB — VITAMIN B12: Vitamin B-12: 949 pg/mL — ABNORMAL HIGH (ref 211–911)

## 2024-02-23 LAB — T3, FREE: T3, Free: 2.8 pg/mL (ref 2.3–4.2)

## 2024-02-25 ENCOUNTER — Ambulatory Visit: Payer: Self-pay | Admitting: Family Medicine

## 2024-03-24 ENCOUNTER — Encounter: Payer: Self-pay | Admitting: Family Medicine

## 2024-03-24 DIAGNOSIS — M1612 Unilateral primary osteoarthritis, left hip: Secondary | ICD-10-CM

## 2024-03-27 MED ORDER — LOSARTAN POTASSIUM 25 MG PO TABS: 25.0000 mg | ORAL_TABLET | Freq: Every day | ORAL | 2 refills | Status: AC

## 2024-03-28 MED ORDER — TRAMADOL HCL 50 MG PO TABS: 50.0000 mg | ORAL_TABLET | Freq: Three times a day (TID) | ORAL | 0 refills | Status: DC | PRN

## 2024-03-28 NOTE — Addendum Note (Signed)
 Addended by: WATT MIRZA on: 03/28/2024 03:35 PM   Modules accepted: Orders

## 2024-04-06 ENCOUNTER — Other Ambulatory Visit: Payer: Self-pay | Admitting: Family Medicine

## 2024-04-06 MED ORDER — SIMVASTATIN 20 MG PO TABS: ORAL_TABLET | ORAL | 3 refills | Status: AC

## 2024-04-11 ENCOUNTER — Encounter: Payer: Self-pay | Admitting: Family Medicine

## 2024-04-11 MED ORDER — TRAMADOL HCL 50 MG PO TABS: 50.0000 mg | ORAL_TABLET | Freq: Three times a day (TID) | ORAL | 2 refills | Status: AC | PRN

## 2024-04-19 ENCOUNTER — Encounter: Payer: Self-pay | Admitting: Family Medicine

## 2024-04-20 ENCOUNTER — Other Ambulatory Visit: Payer: Self-pay | Admitting: Family Medicine

## 2024-04-20 DIAGNOSIS — R8290 Unspecified abnormal findings in urine: Secondary | ICD-10-CM

## 2024-04-21 ENCOUNTER — Ambulatory Visit: Admitting: Neurology

## 2024-04-21 ENCOUNTER — Other Ambulatory Visit: Payer: Self-pay

## 2024-04-21 DIAGNOSIS — R8290 Unspecified abnormal findings in urine: Secondary | ICD-10-CM

## 2024-04-22 LAB — URINALYSIS, ROUTINE W REFLEX MICROSCOPIC
Bilirubin Urine: NEGATIVE
Nitrite: NEGATIVE
Specific Gravity, Urine: 1.02 (ref 1.000–1.030)
Total Protein, Urine: 30 — AB
Urine Glucose: NEGATIVE
Urobilinogen, UA: 0.2 (ref 0.0–1.0)
pH: 6.5 (ref 5.0–8.0)

## 2024-04-22 LAB — URINE CULTURE
MICRO NUMBER:: 17526855
Result:: NO GROWTH
SPECIMEN QUALITY:: ADEQUATE

## 2024-04-23 ENCOUNTER — Ambulatory Visit: Payer: Self-pay | Admitting: Family Medicine

## 2024-04-23 MED ORDER — SULFAMETHOXAZOLE-TRIMETHOPRIM 800-160 MG PO TABS: 1.0000 | ORAL_TABLET | Freq: Two times a day (BID) | ORAL | 0 refills | Status: AC

## 2024-04-28 NOTE — Progress Notes (Signed)
 Kim Cole                                          MRN: [id number]   04/28/2024   The [facility] Quality Team Specialist reviewed this patient medical record for the purposes of chart review for care gap closure. The following were reviewed: chart review for care gap closure-kidney health evaluation for diabetes:eGFR  and uACR.    [facility] Quality Team

## 2024-05-16 ENCOUNTER — Ambulatory Visit: Admitting: Neurology

## 2025-01-16 ENCOUNTER — Ambulatory Visit
